# Patient Record
Sex: Male | Born: 1990 | Race: White | Hispanic: Yes | Marital: Married | Smoking: Never smoker
Health system: Southern US, Community
[De-identification: ages and names within clinical notes are randomized; demographics above are authoritative.]

## PROBLEM LIST (undated history)

## (undated) DIAGNOSIS — F4310 Post-traumatic stress disorder, unspecified: Secondary | ICD-10-CM

## (undated) DIAGNOSIS — R011 Cardiac murmur, unspecified: Secondary | ICD-10-CM

## (undated) DIAGNOSIS — M1990 Unspecified osteoarthritis, unspecified site: Secondary | ICD-10-CM

## (undated) HISTORY — PX: WISDOM TOOTH EXTRACTION: SHX21

## (undated) HISTORY — PX: TONSILLECTOMY: SUR1361

---

## 2015-02-15 DIAGNOSIS — F4310 Post-traumatic stress disorder, unspecified: Secondary | ICD-10-CM

## 2015-02-15 HISTORY — DX: Post-traumatic stress disorder, unspecified: F43.10

## 2017-05-05 ENCOUNTER — Ambulatory Visit: Payer: Self-pay | Admitting: Orthopedic Surgery

## 2017-05-08 NOTE — Patient Instructions (Signed)
Dwayne Kramer  05/08/2017   Your procedure is scheduled on: Thursday 05/11/2017  Report to [facility] Main  Entrance   Report to admitting at  100 PM     Call this number if you have problems the morning of surgery [phone]    Remember: Do not eat food  :After Midnight.May have clear liquids from midnight up until 0900 am then nothing until after surgery!     CLEAR LIQUID DIET   Foods Allowed                                                                     Foods Excluded  Coffee and tea, regular and decaf                             liquids that you cannot  Plain Jell-O in any flavor                                             see through such as: Fruit ices (not with fruit pulp)                                     milk, soups, orange juice  Iced Popsicles                                    All solid food Carbonated beverages, regular and diet                                    Cranberry, grape and apple juices Sports drinks like Gatorade Lightly seasoned clear broth or consume(fat free) Sugar, honey syrup  Sample Menu Breakfast                                Lunch                                     Supper Cranberry juice                    Beef broth                            Chicken broth Jell-O                                     Grape juice                           Apple juice Coffee or  tea                        Jell-O                                      Popsicle                                                Coffee or tea                        Coffee or tea  _____________________________________________________________________     Take these medicines the morning of surgery with A SIP OF WATER: none                                You may not have any metal on your body including hair pins and              piercings  Do not wear jewelry, make-up, lotions, powders or perfumes, deodorant             Do not wear nail polish.  Do  not shave  48 hours prior to surgery.              Men may shave face and neck.   Do not bring valuables to the hospital. [facility]wayne Kramer IS NOT             RESPONSIBLE   FOR VALUABLES.  Contacts, dentures or bridgework may not be worn into surgery.  Leave suitcase in the car. After surgery it may be brought to your room.                  Please read over the following fact sheets you were given: _____________________________________________________________________             [facility] - Preparing for Surgery Before surgery, you can play an important role.  Because skin is not sterile, your skin needs to be as free of germs as possible.  You can reduce the number of germs on your skin by washing with CHG (chlorahexidine gluconate) soap before surgery.  CHG is an antiseptic cleaner which kills germs and bonds with the skin to continue killing germs even after washing. Please DO NOT use if you have an allergy to CHG or antibacterial soaps.  If your skin becomes reddened/irritated stop using the CHG and inform your nurse when you arrive at Short Stay. Do not shave (including legs and underarms) for at least 48 hours prior to the first CHG shower.  You may shave your face/neck. Please follow these instructions carefully:  1.  Shower with CHG Soap the night before surgery and the  morning of Surgery.  2.  If you choose to wash your hair, wash your hair first as usual with your  normal  shampoo.  3.  After you shampoo, rinse your hair and body thoroughly to remove the  shampoo.                           4.  Use CHG as you would any other liquid soap.  You  can apply chg directly  to the skin and wash                       Gently with a scrungie or clean washcloth.  5.  Apply the CHG Soap to your body ONLY FROM THE NECK DOWN.   Do not use on face/ open                           Wound or open sores. Avoid contact with eyes, ears mouth and genitals (private parts).                       Wash face,   Genitals (private parts) with your normal soap.             6.  Wash thoroughly, paying special attention to the area where your surgery  will be performed.  7.  Thoroughly rinse your body with warm water from the neck down.  8.  DO NOT shower/wash with your normal soap after using and rinsing off  the CHG Soap.                9.  Pat yourself dry with a clean towel.            10.  Wear clean pajamas.            11.  Place clean sheets on your bed the night of your first shower and do not  sleep with pets. Day of Surgery : Do not apply any lotions/deodorants the morning of surgery.  Please wear clean clothes to the hospital/surgery center.  FAILURE TO FOLLOW THESE INSTRUCTIONS MAY RESULT IN THE CANCELLATION OF YOUR SURGERY PATIENT SIGNATURE_________________________________  NURSE SIGNATURE__________________________________  ________________________________________________________________________   Dwayne MireIncentive Spirometer  An incentive spirometer is a tool that can help keep your lungs clear and active. This tool measures how well you are filling your lungs with each breath. Taking long deep breaths may help reverse or decrease the chance of developing breathing (pulmonary) problems (especially infection) following:  A long period of time when you are unable to move or be active. BEFORE THE PROCEDURE   If the spirometer includes an indicator to show your best effort, your nurse or respiratory therapist will set it to a desired goal.  If possible, sit up straight or lean slightly forward. Try not to slouch.  Hold the incentive spirometer in an upright position. INSTRUCTIONS FOR USE  1. Sit on the edge of your bed if possible, or sit up as far as you can in bed or on a chair. 2. Hold the incentive spirometer in an upright position. 3. Breathe out normally. 4. Place the mouthpiece in your mouth and seal your lips tightly around it. 5. Breathe in slowly and as deeply as possible, raising  the piston or the ball toward the top of the column. 6. Hold your breath for 3-5 seconds or for as long as possible. Allow the piston or ball to fall to the bottom of the column. 7. Remove the mouthpiece from your mouth and breathe out normally. 8. Rest for a few seconds and repeat Steps 1 through 7 at least 10 times every 1-2 hours when you are awake. Take your time and take a few normal breaths between deep breaths. 9. The spirometer may include an indicator to show your best effort. Use the indicator as a goal to work toward  during each repetition. 10. After each set of 10 deep breaths, practice coughing to be sure your lungs are clear. If you have an incision (the cut made at the time of surgery), support your incision when coughing by placing a pillow or rolled up towels firmly against it. Once you are able to get out of bed, walk around indoors and cough well. You may stop using the incentive spirometer when instructed by your caregiver.  RISKS AND COMPLICATIONS  Take your time so you do not get dizzy or light-headed.  If you are in pain, you may need to take or ask for pain medication before doing incentive spirometry. It is harder to take a deep breath if you are having pain. AFTER USE  Rest and breathe slowly and easily.  It can be helpful to keep track of a log of your progress. Your caregiver can provide you with a simple table to help with this. If you are using the spirometer at home, follow these instructions: [place] IF:   You are having difficultly using the spirometer.  You have trouble using the spirometer as often as instructed.  Your pain medication is not giving enough relief while using the spirometer.  You develop fever of 100.5 F (38.1 C) or higher. SEEK IMMEDIATE MEDICAL CARE IF:   You cough up bloody sputum that had not been present before.  You develop fever of 102 F (38.9 C) or greater.  You develop worsening pain at or near the incision  site. MAKE SURE YOU:   Understand these instructions.  Will watch your condition.  Will get help right away if you are not doing well or get worse. Document Released: 06/13/2006 Document Revised: 04/25/2011 Document Reviewed: 08/14/2006 [employer] Patient Information 2014 [employer], [place].   ________________________________________________________________________  WHAT IS A BLOOD TRANSFUSION? Blood Transfusion Information  A transfusion is the replacement of blood or some of its parts. Blood is made up of multiple cells which provide different functions.  Red blood cells carry oxygen and are used for blood loss replacement.  White blood cells fight against infection.  Platelets control bleeding.  Plasma helps clot blood.  Other blood products are available for specialized needs, such as hemophilia or other clotting disorders. BEFORE THE TRANSFUSION  Who gives blood for transfusions?   Healthy volunteers who are fully evaluated to make sure their blood is safe. This is blood bank blood. Transfusion therapy is the safest it has ever been in the practice of medicine. Before blood is taken from a donor, a complete history is taken to make sure that person has no history of diseases nor engages in risky social behavior (examples are intravenous drug use or sexual activity with multiple partners). The donor's travel history is screened to minimize risk of transmitting infections, such as malaria. The donated blood is tested for signs of infectious diseases, such as HIV and hepatitis. The blood is then tested to be sure it is compatible with you in order to minimize the chance of a transfusion reaction. If you or a relative donates blood, this is often done in anticipation of surgery and is not appropriate for emergency situations. It takes many days to process the donated blood. RISKS AND COMPLICATIONS Although transfusion therapy is very safe and saves many lives, the main dangers of  transfusion include:   Getting an infectious disease.  Developing a transfusion reaction. This is an allergic reaction to something in the blood you were given. Every precaution is taken to  prevent this. The decision to have a blood transfusion has been considered carefully by your caregiver before blood is given. Blood is not given unless the benefits outweigh the risks. AFTER THE TRANSFUSION  Right after receiving a blood transfusion, you will usually feel much better and more energetic. This is especially true if your red blood cells have gotten low (anemic). The transfusion raises the level of the red blood cells which carry oxygen, and this usually causes an energy increase.  The nurse administering the transfusion will monitor you carefully for complications. HOME CARE INSTRUCTIONS  No special instructions are needed after a transfusion. You may find your energy is better. Speak with your caregiver about any limitations on activity for underlying diseases you may have. SEEK MEDICAL CARE IF:   Your condition is not improving after your transfusion.  You develop redness or irritation at the intravenous (IV) site. SEEK IMMEDIATE MEDICAL CARE IF:  Any of the following symptoms occur over the next 12 hours:  Shaking chills.  You have a temperature by mouth above 102 F (38.9 C), not controlled by medicine.  Chest, back, or muscle pain.  People around you feel you are not acting correctly or are confused.  Shortness of breath or difficulty breathing.  Dizziness and fainting.  You get a rash or develop hives.  You have a decrease in urine output.  Your urine turns a dark color or changes to pink, red, or brown. Any of the following symptoms occur over the next 10 days:  You have a temperature by mouth above 102 F (38.9 C), not controlled by medicine.  Shortness of breath.  Weakness after normal activity.  The white part of the eye turns yellow (jaundice).  You have a  decrease in the amount of urine or are urinating less often.  Your urine turns a dark color or changes to pink, red, or brown. Document Released: 01/29/2000 Document Revised: 04/25/2011 Document Reviewed: 09/17/2007 [facility] Patient Information 2014 [place], [place].  _______________________________________________________________________

## 2017-05-09 ENCOUNTER — Ambulatory Visit: Payer: Self-pay | Admitting: Orthopedic Surgery

## 2017-05-09 ENCOUNTER — Encounter (HOSPITAL_COMMUNITY)
Admission: RE | Admit: 2017-05-09 | Discharge: 2017-05-09 | Disposition: A | Discharge status: Home / Self Care | Payer: No Typology Code available for payment source | Source: Ambulatory Visit | Attending: Orthopedic Surgery | Admitting: Orthopedic Surgery

## 2017-05-09 ENCOUNTER — Other Ambulatory Visit: Payer: Self-pay

## 2017-05-09 ENCOUNTER — Encounter (HOSPITAL_COMMUNITY): Payer: Self-pay

## 2017-05-09 DIAGNOSIS — Z01812 Encounter for preprocedural laboratory examination: Secondary | ICD-10-CM

## 2017-05-09 DIAGNOSIS — M8788 Other osteonecrosis, other site: Secondary | ICD-10-CM | POA: Insufficient documentation

## 2017-05-09 HISTORY — DX: Unspecified osteoarthritis, unspecified site: M19.90

## 2017-05-09 LAB — CBC
HEMATOCRIT: 43.5 % (ref 39.0–52.0)
Hemoglobin: 15.3 g/dL (ref 13.0–17.0)
MCH: 33.5 pg (ref 26.0–34.0)
MCHC: 35.2 g/dL (ref 30.0–36.0)
MCV: 95.2 fL (ref 78.0–100.0)
Platelets: 268 10*3/uL (ref 150–400)
RBC: 4.57 MIL/uL (ref 4.22–5.81)
RDW: 13 % (ref 11.5–15.5)
WBC: 5.7 10*3/uL (ref 4.0–10.5)

## 2017-05-09 LAB — BASIC METABOLIC PANEL
Anion gap: 9 (ref 5–15)
BUN: 10 mg/dL (ref 6–20)
CALCIUM: 9.8 mg/dL (ref 8.9–10.3)
CHLORIDE: 102 mmol/L (ref 101–111)
CO2: 26 mmol/L (ref 22–32)
Creatinine, Ser: 0.94 mg/dL (ref 0.61–1.24)
GFR calc Af Amer: >60 mL/min (ref >60)
GFR calc non Af Amer: >60 mL/min (ref >60)
GLUCOSE: 102 mg/dL — AB (ref 65–99)
Potassium: 4.4 mmol/L (ref 3.5–5.1)
Sodium: 137 mmol/L (ref 135–145)

## 2017-05-09 LAB — SURGICAL PCR SCREEN
MRSA, PCR: NEGATIVE
Staphylococcus aureus: POSITIVE — AB

## 2017-05-09 LAB — ABO/RH: ABO/RH(D): A POS

## 2017-05-09 NOTE — H&P (View-Only) (Signed)
TOTAL HIP ADMISSION H&P  Patient is admitted for right total hip arthroplasty.  Subjective:  Chief Complaint: right hip pain  HPI: Dwayne Kramer, 27 y.o. 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  Onset of symptoms was gradual starting 2 years ago with rapidlly worsening course since that time.The patient noted no past surgery on the right hip(s).  Patient currently rates pain in the right hip at 10 out of 10 with activity. Patient has night pain, worsening of pain with activity and weight bearing, pain that interfers with activities of daily living, pain with passive range of motion and joint swelling. Patient has evidence of AVN by imaging studies. This condition presents safety issues increasing the risk of falls.There is no current active infection.  There are no active problems to display for this patient.  Past Medical History:  Diagnosis Date  . Arthritis     Past Surgical History:  Procedure Laterality Date  . TONSILLECTOMY    . WISDOM TOOTH EXTRACTION      Current Outpatient Medications  Medication Sig Dispense Refill Last Dose  . cyclobenzaprine (FLEXERIL) 10 MG tablet Take 10 mg by mouth 2 (two) times daily.     . diclofenac sodium (VOLTAREN) 1 % GEL Apply 1 application topically 3 (three) times daily.     Marland Kitchen gabapentin (NEURONTIN) 300 MG capsule Take 300 mg by mouth at bedtime.     Marland Kitchen ibuprofen (ADVIL,MOTRIN) 800 MG tablet Take 800 mg by mouth every 8 (eight) hours as needed for headache or moderate pain.      No current facility-administered medications for this visit.    No Known Allergies  Social History   Tobacco Use  . Smoking status: Never Smoker  . Smokeless tobacco: Never  Used  Substance Use Topics  . Alcohol use: Yes    Comment: occassionally    No family history on file.   Review of Systems  Constitutional: Negative.   HENT: Negative.   Eyes: Negative.   Respiratory: Negative.   Cardiovascular: Negative.   Gastrointestinal: Negative.   Genitourinary: Negative.   Musculoskeletal: Positive for joint pain.  Skin: Negative.   Neurological: Negative.   Endo/Heme/Allergies: Negative.   Psychiatric/Behavioral: Negative.     Objective:  Physical Exam  Vitals reviewed. Constitutional: He is oriented to person, place, and time. He appears well-developed and well-nourished.  HENT:  Head: Normocephalic and atraumatic.  Eyes: Pupils are equal, round, and reactive to light. Conjunctivae and EOM are normal.  Neck: Normal range of motion. Neck supple. No thyromegaly present.  Cardiovascular: Normal rate, regular rhythm and intact distal pulses.  Respiratory: Effort normal. No respiratory distress.  GI: Soft. He exhibits no distension.  Genitourinary:  Genitourinary Comments: deferred  Musculoskeletal:       Right hip: He exhibits decreased range of motion and bony tenderness.  Neurological: He is alert and oriented to person, place, and time. He has normal reflexes.  Skin: Skin is warm and dry.  Psychiatric: He has a normal mood and affect. His behavior is normal. Judgment and thought content normal.    Vital signs in last 24 hours: @VSRANGES @  Labs:   Estimated body mass index is 31.17 kg/m as calculated from the following:   Height as of an earlier encounter  on 05/09/17: 5\' 7"  (1.702 m).   Weight as of an earlier encounter on 05/09/17: 90.3 kg (199 lb).   Imaging Review Plain radiographs demonstrate AVN with collapse of the right hip(s). The bone quality appears to be adequate for age and reported activity level.  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 The treatment options including medical management, injection therapy, arthroscopy and arthroplasty were discussed at length. The risks and benefits of total hip arthroplasty were presented and reviewed. The risks due to aseptic loosening, infection, stiffness, dislocation/subluxation,  thromboembolic complications and other imponderables were discussed.  The patient acknowledged the explanation, agreed to proceed with the plan and consent was signed. Patient is being admitted for inpatient treatment for surgery, pain control, PT, OT, prophylactic antibiotics, VTE prophylaxis, progressive ambulation and ADL's and discharge planning.The patient is planning to be discharged home with HEP

## 2017-05-09 NOTE — H&P (Signed)
TOTAL HIP ADMISSION H&P  Patient is admitted for right total hip arthroplasty.  Subjective:  Chief Complaint: right hip pain  HPI: Dwayne Kramer, 27 y.o. 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  Onset of symptoms was gradual starting 2 years ago with rapidlly worsening course since that time.The patient noted no past surgery on the right hip(s).  Patient currently rates pain in the right hip at 10 out of 10 with activity. Patient has night pain, worsening of pain with activity and weight bearing, pain that interfers with activities of daily living, pain with passive range of motion and joint swelling. Patient has evidence of AVN by imaging studies. This condition presents safety issues increasing the risk of falls.There is no current active infection.  There are no active problems to display for this patient.  Past Medical History:  Diagnosis Date  . Arthritis     Past Surgical History:  Procedure Laterality Date  . TONSILLECTOMY    . WISDOM TOOTH EXTRACTION      Current Outpatient Medications  Medication Sig Dispense Refill Last Dose  . cyclobenzaprine (FLEXERIL) 10 MG tablet Take 10 mg by mouth 2 (two) times daily.     . diclofenac sodium (VOLTAREN) 1 % GEL Apply 1 application topically 3 (three) times daily.     Marland Kitchen gabapentin (NEURONTIN) 300 MG capsule Take 300 mg by mouth at bedtime.     Marland Kitchen ibuprofen (ADVIL,MOTRIN) 800 MG tablet Take 800 mg by mouth every 8 (eight) hours as needed for headache or moderate pain.      No current facility-administered medications for this visit.    No Known Allergies  Social History   Tobacco Use  . Smoking status: Never Smoker  . Smokeless tobacco: Never  Used  Substance Use Topics  . Alcohol use: Yes    Comment: occassionally    No family history on file.   Review of Systems  Constitutional: Negative.   HENT: Negative.   Eyes: Negative.   Respiratory: Negative.   Cardiovascular: Negative.   Gastrointestinal: Negative.   Genitourinary: Negative.   Musculoskeletal: Positive for joint pain.  Skin: Negative.   Neurological: Negative.   Endo/Heme/Allergies: Negative.   Psychiatric/Behavioral: Negative.     Objective:  Physical Exam  Vitals reviewed. Constitutional: He is oriented to person, place, and time. He appears well-developed and well-nourished.  HENT:  Head: Normocephalic and atraumatic.  Eyes: Pupils are equal, round, and reactive to light. Conjunctivae and EOM are normal.  Neck: Normal range of motion. Neck supple. No thyromegaly present.  Cardiovascular: Normal rate, regular rhythm and intact distal pulses.  Respiratory: Effort normal. No respiratory distress.  GI: Soft. He exhibits no distension.  Genitourinary:  Genitourinary Comments: deferred  Musculoskeletal:       Right hip: He exhibits decreased range of motion and bony tenderness.  Neurological: He is alert and oriented to person, place, and time. He has normal reflexes.  Skin: Skin is warm and dry.  Psychiatric: He has a normal mood and affect. His behavior is normal. Judgment and thought content normal.    Vital signs in last 24 hours: @VSRANGES @  Labs:   Estimated body mass index is 31.17 kg/m as calculated from the following:   Height as of an earlier encounter  on 05/09/17: 5\' 7"  (1.702 m).   Weight as of an earlier encounter on 05/09/17: 90.3 kg (199 lb).   Imaging Review Plain radiographs demonstrate AVN with collapse of the right hip(s). The bone quality appears to be adequate for age and reported activity level.  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 The treatment options including medical management, injection therapy, arthroscopy and arthroplasty were discussed at length. The risks and benefits of total hip arthroplasty were presented and reviewed. The risks due to aseptic loosening, infection, stiffness, dislocation/subluxation,  thromboembolic complications and other imponderables were discussed.  The patient acknowledged the explanation, agreed to proceed with the plan and consent was signed. Patient is being admitted for inpatient treatment for surgery, pain control, PT, OT, prophylactic antibiotics, VTE prophylaxis, progressive ambulation and ADL's and discharge planning.The patient is planning to be discharged home with HEP

## 2017-05-10 MED ORDER — TRANEXAMIC ACID 1000 MG/10ML IV SOLN
1000.0000 mg | INTRAVENOUS | Status: AC
  Administered 2017-05-11: 1000 mg via INTRAVENOUS
  Filled 2017-05-10: qty 1100

## 2017-05-11 ENCOUNTER — Inpatient Hospital Stay (HOSPITAL_COMMUNITY): Payer: No Typology Code available for payment source | Admitting: Registered Nurse

## 2017-05-11 ENCOUNTER — Telehealth (HOSPITAL_COMMUNITY): Payer: Self-pay | Admitting: *Deleted

## 2017-05-11 ENCOUNTER — Inpatient Hospital Stay (HOSPITAL_COMMUNITY): Payer: No Typology Code available for payment source

## 2017-05-11 ENCOUNTER — Encounter (HOSPITAL_COMMUNITY): Payer: Self-pay | Admitting: Registered Nurse

## 2017-05-11 ENCOUNTER — Encounter (HOSPITAL_COMMUNITY)
Admission: RE | Disposition: A | Discharge status: Home / Self Care | Payer: Self-pay | Source: Ambulatory Visit | Attending: Orthopedic Surgery

## 2017-05-11 ENCOUNTER — Inpatient Hospital Stay (HOSPITAL_COMMUNITY)
Admission: RE | Admit: 2017-05-11 | Discharge: 2017-05-12 | DRG: 470 | Disposition: A | Discharge status: Home / Self Care | Payer: No Typology Code available for payment source | Source: Ambulatory Visit | Attending: Orthopedic Surgery | Admitting: Orthopedic Surgery

## 2017-05-11 DIAGNOSIS — M87051 Idiopathic aseptic necrosis of right femur: Secondary | ICD-10-CM | POA: Diagnosis present

## 2017-05-11 DIAGNOSIS — M1611 Unilateral primary osteoarthritis, right hip: Secondary | ICD-10-CM | POA: Diagnosis present

## 2017-05-11 DIAGNOSIS — Z09 Encounter for follow-up examination after completed treatment for conditions other than malignant neoplasm: Secondary | ICD-10-CM

## 2017-05-11 DIAGNOSIS — M879 Osteonecrosis, unspecified: Secondary | ICD-10-CM | POA: Diagnosis present

## 2017-05-11 DIAGNOSIS — Z9181 History of falling: Secondary | ICD-10-CM

## 2017-05-11 DIAGNOSIS — Z419 Encounter for procedure for purposes other than remedying health state, unspecified: Secondary | ICD-10-CM

## 2017-05-11 HISTORY — PX: TOTAL HIP ARTHROPLASTY: SHX124

## 2017-05-11 LAB — TYPE AND SCREEN
ABO/RH(D): A POS
Antibody Screen: NEGATIVE

## 2017-05-11 SURGERY — ARTHROPLASTY, HIP, TOTAL, ANTERIOR APPROACH
Anesthesia: Spinal | Site: Hip | Laterality: Right

## 2017-05-11 MED ORDER — DOCUSATE SODIUM 100 MG PO CAPS
100.0000 mg | ORAL_CAPSULE | Freq: Two times a day (BID) | ORAL | Status: DC
  Administered 2017-05-11 – 2017-05-12 (×2): 100 mg via ORAL
  Filled 2017-05-11 (×2): qty 1

## 2017-05-11 MED ORDER — OXYCODONE HCL 5 MG/5ML PO SOLN
5.0000 mg | Freq: Once | ORAL | Status: DC | PRN
  Filled 2017-05-11: qty 5

## 2017-05-11 MED ORDER — MORPHINE SULFATE (PF) 2 MG/ML IV SOLN
0.5000 mg | INTRAVENOUS | Status: DC | PRN
  Administered 2017-05-12: 1 mg via INTRAVENOUS
  Filled 2017-05-11: qty 1

## 2017-05-11 MED ORDER — OXYCODONE HCL 5 MG PO TABS: 5.0000 mg | ORAL_TABLET | Freq: Once | ORAL | Status: DC | PRN

## 2017-05-11 MED ORDER — KETOROLAC TROMETHAMINE 30 MG/ML IJ SOLN
INTRAMUSCULAR | Status: DC | PRN
  Administered 2017-05-11: 30 mg

## 2017-05-11 MED ORDER — METHOCARBAMOL 500 MG PO TABS
500.0000 mg | ORAL_TABLET | Freq: Four times a day (QID) | ORAL | Status: DC | PRN
  Administered 2017-05-12: 500 mg via ORAL
  Filled 2017-05-11: qty 1

## 2017-05-11 MED ORDER — DIPHENHYDRAMINE HCL 12.5 MG/5ML PO ELIX: 12.5000 mg | ORAL_SOLUTION | ORAL | Status: DC | PRN

## 2017-05-11 MED ORDER — POVIDONE-IODINE 10 % EX SWAB: 2.0000 "application " | Freq: Once | CUTANEOUS | Status: DC

## 2017-05-11 MED ORDER — PROPOFOL 10 MG/ML IV BOLUS
INTRAVENOUS | Status: AC
  Filled 2017-05-11: qty 20

## 2017-05-11 MED ORDER — POLYETHYLENE GLYCOL 3350 17 G PO PACK: 17.0000 g | PACK | Freq: Every day | ORAL | Status: DC | PRN

## 2017-05-11 MED ORDER — HYDROCODONE-ACETAMINOPHEN 5-325 MG PO TABS
1.0000 | ORAL_TABLET | ORAL | Status: DC | PRN
  Administered 2017-05-11 – 2017-05-12 (×3): 2 via ORAL
  Filled 2017-05-11 (×3): qty 2

## 2017-05-11 MED ORDER — WATER FOR IRRIGATION, STERILE IR SOLN
Status: DC | PRN
  Administered 2017-05-11: 2000 mL

## 2017-05-11 MED ORDER — SODIUM CHLORIDE 0.9 % IR SOLN
Status: DC | PRN
  Administered 2017-05-11: 4000 mL

## 2017-05-11 MED ORDER — METOCLOPRAMIDE HCL 5 MG PO TABS: 5.0000 mg | ORAL_TABLET | Freq: Three times a day (TID) | ORAL | Status: DC | PRN

## 2017-05-11 MED ORDER — FENTANYL CITRATE (PF) 100 MCG/2ML IJ SOLN
INTRAMUSCULAR | Status: DC | PRN
  Administered 2017-05-11: 100 ug via INTRAVENOUS
  Administered 2017-05-11 (×2): 50 ug via INTRAVENOUS

## 2017-05-11 MED ORDER — ONDANSETRON HCL 4 MG/2ML IJ SOLN
4.0000 mg | Freq: Four times a day (QID) | INTRAMUSCULAR | Status: DC | PRN
  Filled 2017-05-11: qty 2

## 2017-05-11 MED ORDER — FENTANYL CITRATE (PF) 100 MCG/2ML IJ SOLN
INTRAMUSCULAR | Status: AC
  Filled 2017-05-11: qty 2

## 2017-05-11 MED ORDER — SODIUM CHLORIDE 0.9 % IJ SOLN
INTRAMUSCULAR | Status: DC | PRN
  Administered 2017-05-11: 29 mL

## 2017-05-11 MED ORDER — HYDROMORPHONE HCL 1 MG/ML IJ SOLN
0.2500 mg | INTRAMUSCULAR | Status: DC | PRN
  Administered 2017-05-11 (×4): 0.5 mg via INTRAVENOUS

## 2017-05-11 MED ORDER — ONDANSETRON HCL 4 MG/2ML IJ SOLN
INTRAMUSCULAR | Status: DC | PRN
  Administered 2017-05-11: 4 mg via INTRAVENOUS

## 2017-05-11 MED ORDER — HYDROMORPHONE HCL 1 MG/ML IJ SOLN
INTRAMUSCULAR | Status: AC
  Filled 2017-05-11: qty 1

## 2017-05-11 MED ORDER — PHENOL 1.4 % MT LIQD
1.0000 | OROMUCOSAL | Status: DC | PRN
  Filled 2017-05-11: qty 177

## 2017-05-11 MED ORDER — MENTHOL 3 MG MT LOZG: 1.0000 | LOZENGE | OROMUCOSAL | Status: DC | PRN

## 2017-05-11 MED ORDER — ASPIRIN 81 MG PO CHEW
81.0000 mg | CHEWABLE_TABLET | Freq: Two times a day (BID) | ORAL | Status: DC
  Administered 2017-05-11 – 2017-05-12 (×2): 81 mg via ORAL
  Filled 2017-05-11 (×2): qty 1

## 2017-05-11 MED ORDER — ACETAMINOPHEN 10 MG/ML IV SOLN
INTRAVENOUS | Status: AC
  Filled 2017-05-11: qty 100

## 2017-05-11 MED ORDER — SODIUM CHLORIDE 0.9 % IV SOLN
INTRAVENOUS | Status: DC
  Administered 2017-05-11 – 2017-05-12 (×3): via INTRAVENOUS

## 2017-05-11 MED ORDER — HYDROCODONE-ACETAMINOPHEN 7.5-325 MG PO TABS: 1.0000 | ORAL_TABLET | ORAL | Status: DC | PRN

## 2017-05-11 MED ORDER — ONDANSETRON HCL 4 MG/2ML IJ SOLN
INTRAMUSCULAR | Status: AC
  Filled 2017-05-11: qty 2

## 2017-05-11 MED ORDER — METOCLOPRAMIDE HCL 5 MG/ML IJ SOLN: 5.0000 mg | Freq: Three times a day (TID) | INTRAMUSCULAR | Status: DC | PRN

## 2017-05-11 MED ORDER — ACETAMINOPHEN 10 MG/ML IV SOLN
1000.0000 mg | INTRAVENOUS | Status: AC
  Administered 2017-05-11: 1000 mg via INTRAVENOUS
  Filled 2017-05-11: qty 100

## 2017-05-11 MED ORDER — CEFAZOLIN SODIUM-DEXTROSE 2-4 GM/100ML-% IV SOLN
2.0000 g | Freq: Four times a day (QID) | INTRAVENOUS | Status: AC
  Administered 2017-05-11 – 2017-05-12 (×2): 2 g via INTRAVENOUS
  Filled 2017-05-11 (×2): qty 100

## 2017-05-11 MED ORDER — CHLORHEXIDINE GLUCONATE 4 % EX LIQD: 60.0000 mL | Freq: Once | CUTANEOUS | Status: DC

## 2017-05-11 MED ORDER — ISOPROPYL ALCOHOL 70 % SOLN
Status: DC | PRN
  Administered 2017-05-11: 1 via TOPICAL

## 2017-05-11 MED ORDER — MIDAZOLAM HCL 2 MG/2ML IJ SOLN
INTRAMUSCULAR | Status: AC
  Filled 2017-05-11: qty 2

## 2017-05-11 MED ORDER — CEFAZOLIN SODIUM-DEXTROSE 2-4 GM/100ML-% IV SOLN
2.0000 g | INTRAVENOUS | Status: AC
  Administered 2017-05-11: 2 g via INTRAVENOUS
  Filled 2017-05-11: qty 100

## 2017-05-11 MED ORDER — ALUM & MAG HYDROXIDE-SIMETH 200-200-20 MG/5ML PO SUSP: 30.0000 mL | ORAL | Status: DC | PRN

## 2017-05-11 MED ORDER — PROPOFOL 10 MG/ML IV BOLUS
INTRAVENOUS | Status: AC
  Filled 2017-05-11: qty 40

## 2017-05-11 MED ORDER — LACTATED RINGERS IV SOLN
INTRAVENOUS | Status: DC
  Administered 2017-05-11 (×2): via INTRAVENOUS
  Administered 2017-05-11: 1000 mL via INTRAVENOUS

## 2017-05-11 MED ORDER — MIDAZOLAM HCL 5 MG/5ML IJ SOLN
INTRAMUSCULAR | Status: DC | PRN
  Administered 2017-05-11 (×2): 2 mg via INTRAVENOUS

## 2017-05-11 MED ORDER — DEXAMETHASONE SODIUM PHOSPHATE 10 MG/ML IJ SOLN
INTRAMUSCULAR | Status: AC
  Filled 2017-05-11: qty 1

## 2017-05-11 MED ORDER — ONDANSETRON HCL 4 MG PO TABS
4.0000 mg | ORAL_TABLET | Freq: Four times a day (QID) | ORAL | Status: DC | PRN
  Administered 2017-05-11: 4 mg via ORAL

## 2017-05-11 MED ORDER — DEXAMETHASONE SODIUM PHOSPHATE 10 MG/ML IJ SOLN
10.0000 mg | Freq: Once | INTRAMUSCULAR | Status: AC
  Administered 2017-05-12: 10 mg via INTRAVENOUS
  Filled 2017-05-11: qty 1

## 2017-05-11 MED ORDER — ACETAMINOPHEN 10 MG/ML IV SOLN: 1000.0000 mg | Freq: Once | INTRAVENOUS | Status: DC | PRN

## 2017-05-11 MED ORDER — FENTANYL CITRATE (PF) 100 MCG/2ML IJ SOLN
INTRAMUSCULAR | Status: AC
Start: 2017-05-11 — End: 2017-05-11
  Filled 2017-05-11: qty 2

## 2017-05-11 MED ORDER — SODIUM CHLORIDE 0.9 % IV SOLN: INTRAVENOUS | Status: DC

## 2017-05-11 MED ORDER — PROMETHAZINE HCL 25 MG/ML IJ SOLN
6.2500 mg | INTRAMUSCULAR | Status: DC | PRN
Start: 2017-05-11 — End: 2017-05-11

## 2017-05-11 MED ORDER — SODIUM CHLORIDE 0.9 % IR SOLN
Status: DC | PRN
  Administered 2017-05-11: 1000 mL

## 2017-05-11 MED ORDER — DEXAMETHASONE SODIUM PHOSPHATE 10 MG/ML IJ SOLN
INTRAMUSCULAR | Status: DC | PRN
  Administered 2017-05-11: 10 mg via INTRAVENOUS

## 2017-05-11 MED ORDER — BUPIVACAINE HCL (PF) 0.25 % IJ SOLN
INTRAMUSCULAR | Status: DC | PRN
  Administered 2017-05-11: 30 mL

## 2017-05-11 MED ORDER — PROPOFOL 10 MG/ML IV BOLUS
INTRAVENOUS | Status: DC | PRN
  Administered 2017-05-11: 20 mg via INTRAVENOUS

## 2017-05-11 MED ORDER — GABAPENTIN 300 MG PO CAPS
300.0000 mg | ORAL_CAPSULE | Freq: Every day | ORAL | Status: DC
  Administered 2017-05-11: 300 mg via ORAL
  Filled 2017-05-11: qty 1

## 2017-05-11 MED ORDER — KETOROLAC TROMETHAMINE 15 MG/ML IJ SOLN
15.0000 mg | Freq: Four times a day (QID) | INTRAMUSCULAR | Status: DC
  Administered 2017-05-11 – 2017-05-12 (×2): 15 mg via INTRAVENOUS
  Filled 2017-05-11 (×2): qty 1

## 2017-05-11 MED ORDER — PROPOFOL 500 MG/50ML IV EMUL
INTRAVENOUS | Status: DC | PRN
  Administered 2017-05-11: 125 ug/kg/min via INTRAVENOUS

## 2017-05-11 MED ORDER — BUPIVACAINE HCL (PF) 0.5 % IJ SOLN
INTRAMUSCULAR | Status: AC
  Filled 2017-05-11: qty 30

## 2017-05-11 MED ORDER — CEFAZOLIN SODIUM-DEXTROSE 2-4 GM/100ML-% IV SOLN
INTRAVENOUS | Status: AC
  Filled 2017-05-11: qty 100

## 2017-05-11 MED ORDER — BUPIVACAINE HCL (PF) 0.25 % IJ SOLN
INTRAMUSCULAR | Status: AC
  Filled 2017-05-11: qty 30

## 2017-05-11 MED ORDER — METHOCARBAMOL 1000 MG/10ML IJ SOLN
500.0000 mg | Freq: Four times a day (QID) | INTRAVENOUS | Status: DC | PRN
  Administered 2017-05-11: 500 mg via INTRAVENOUS
  Filled 2017-05-11: qty 550

## 2017-05-11 MED ORDER — ACETAMINOPHEN 325 MG PO TABS: 325.0000 mg | ORAL_TABLET | Freq: Four times a day (QID) | ORAL | Status: DC | PRN

## 2017-05-11 SURGICAL SUPPLY — 53 items
BAG ZIPLOCK 12X15 (MISCELLANEOUS) ×3 IMPLANT
CAPT HIP TOTAL 2 ×3 IMPLANT
CHLORAPREP W/TINT 26ML (MISCELLANEOUS) ×6 IMPLANT
CLOTH BEACON ORANGE TIMEOUT ST (SAFETY) ×3 IMPLANT
COVER PERINEAL POST (MISCELLANEOUS) ×3 IMPLANT
COVER SURGICAL LIGHT HANDLE (MISCELLANEOUS) ×3 IMPLANT
DECANTER SPIKE VIAL GLASS SM (MISCELLANEOUS) ×3 IMPLANT
DERMABOND ADVANCED (GAUZE/BANDAGES/DRESSINGS) ×4
DERMABOND ADVANCED .7 DNX12 (GAUZE/BANDAGES/DRESSINGS) ×2 IMPLANT
DRAPE SHEET LG 3/4 BI-LAMINATE (DRAPES) ×9 IMPLANT
DRAPE STERI IOBAN 125X83 (DRAPES) ×3 IMPLANT
DRAPE U-SHAPE 47X51 STRL (DRAPES) ×6 IMPLANT
DRESSING AQUACEL AG SP 3.5X10 (GAUZE/BANDAGES/DRESSINGS) ×1 IMPLANT
DRSG AQUACEL AG ADV 3.5X10 (GAUZE/BANDAGES/DRESSINGS) ×3 IMPLANT
DRSG AQUACEL AG SP 3.5X10 (GAUZE/BANDAGES/DRESSINGS) ×3
ELECT PENCIL ROCKER SW 15FT (MISCELLANEOUS) ×6 IMPLANT
ELECT REM PT RETURN 15FT ADLT (MISCELLANEOUS) ×3 IMPLANT
GAUZE SPONGE 4X4 12PLY STRL (GAUZE/BANDAGES/DRESSINGS) ×3 IMPLANT
GLOVE BIO SURGEON STRL SZ8.5 (GLOVE) ×12 IMPLANT
GLOVE BIOGEL PI IND STRL 7.0 (GLOVE) ×2 IMPLANT
GLOVE BIOGEL PI IND STRL 7.5 (GLOVE) ×4 IMPLANT
GLOVE BIOGEL PI IND STRL 8 (GLOVE) ×1 IMPLANT
GLOVE BIOGEL PI IND STRL 8.5 (GLOVE) ×1 IMPLANT
GLOVE BIOGEL PI INDICATOR 7.0 (GLOVE) ×4
GLOVE BIOGEL PI INDICATOR 7.5 (GLOVE) ×8
GLOVE BIOGEL PI INDICATOR 8 (GLOVE) ×2
GLOVE BIOGEL PI INDICATOR 8.5 (GLOVE) ×2
GLOVE ECLIPSE 7.0 STRL STRAW (GLOVE) ×3 IMPLANT
GLOVE SURG SS PI 7.5 STRL IVOR (GLOVE) ×3 IMPLANT
GLOVE SURG SS PI 8.0 STRL IVOR (GLOVE) ×3 IMPLANT
GOWN SPEC L3 XXLG W/TWL (GOWN DISPOSABLE) ×6 IMPLANT
GOWN STRL REUS W/TWL 2XL LVL3 (GOWN DISPOSABLE) ×3 IMPLANT
GOWN STRL REUS W/TWL XL LVL3 (GOWN DISPOSABLE) ×3 IMPLANT
HANDPIECE INTERPULSE COAX TIP (DISPOSABLE) ×2
HOLDER FOLEY CATH W/STRAP (MISCELLANEOUS) ×3 IMPLANT
HOOD PEEL AWAY FLYTE STAYCOOL (MISCELLANEOUS) ×12 IMPLANT
MARKER SKIN DUAL TIP RULER LAB (MISCELLANEOUS) ×3 IMPLANT
NEEDLE SPNL 18GX3.5 QUINCKE PK (NEEDLE) ×3 IMPLANT
PACK ANTERIOR HIP CUSTOM (KITS) ×3 IMPLANT
SAW OSC TIP CART 19.5X105X1.3 (SAW) ×3 IMPLANT
SEALER BIPOLAR AQUA 6.0 (INSTRUMENTS) ×3 IMPLANT
SET HNDPC FAN SPRY TIP SCT (DISPOSABLE) ×1 IMPLANT
SUT ETHIBOND NAB CT1 #1 30IN (SUTURE) ×6 IMPLANT
SUT MNCRL AB 3-0 PS2 18 (SUTURE) ×3 IMPLANT
SUT MON AB 2-0 CT1 36 (SUTURE) ×6 IMPLANT
SUT STRATAFIX PDO 1 14 VIOLET (SUTURE) ×2
SUT STRATFX PDO 1 14 VIOLET (SUTURE) ×1
SUT VIC AB 2-0 CT1 27 (SUTURE) ×2
SUT VIC AB 2-0 CT1 TAPERPNT 27 (SUTURE) ×1 IMPLANT
SUTURE STRATFX PDO 1 14 VIOLET (SUTURE) ×1 IMPLANT
SYR 50ML LL SCALE MARK (SYRINGE) ×3 IMPLANT
TRAY FOLEY W/METER SILVER 16FR (SET/KITS/TRAYS/PACK) ×3 IMPLANT
YANKAUER SUCT BULB TIP 10FT TU (MISCELLANEOUS) ×3 IMPLANT

## 2017-05-11 NOTE — Anesthesia Preprocedure Evaluation (Addendum)
Anesthesia Evaluation  Patient identified by MRN, date of birth, ID band Patient awake    Reviewed: Allergy & Precautions, NPO status , Patient's Chart, lab work & pertinent test results  Airway Mallampati: II  TM Distance: >3 FB Neck ROM: Full    Dental no notable dental hx.    Pulmonary neg pulmonary ROS,    Pulmonary exam normal breath sounds clear to auscultation       Cardiovascular negative cardio ROS Normal cardiovascular exam Rhythm:Regular Rate:Normal     Neuro/Psych negative neurological ROS  negative psych ROS   GI/Hepatic negative GI ROS, Neg liver ROS,   Endo/Other  negative endocrine ROS  Renal/GU negative Renal ROS  negative genitourinary   Musculoskeletal  (+) Arthritis , Osteoarthritis,    Abdominal   Peds negative pediatric ROS (+)  Hematology negative hematology ROS (+)   Anesthesia Other Findings   Reproductive/Obstetrics negative OB ROS                             Anesthesia Physical Anesthesia Plan  ASA: II  Anesthesia Plan: Spinal   Post-op Pain Management:    Induction: Intravenous  PONV Risk Score and Plan: 2 and Ondansetron, Dexamethasone and Treatment may vary due to age or medical condition  Airway Management Planned: Simple Face Mask  Additional Equipment:   Intra-op Plan:   Post-operative Plan:   Informed Consent: I have reviewed the patients History and Physical, chart, labs and discussed the procedure including the risks, benefits and alternatives for the proposed anesthesia with the patient or authorized representative who has indicated his/her understanding and acceptance.     Dental advisory given  Plan Discussed with: CRNA and Surgeon  Anesthesia Plan Comments:         Anesthesia Quick Evaluation  

## 2017-05-11 NOTE — Interval H&P Note (Signed)
History and Physical Interval Note:  05/11/2017 1:14 PM  Dwayne Kramer  has presented today for surgery, with the diagnosis of Avascular necrosis right hip  The various methods of treatment have been discussed with the patient and family. After consideration of risks, benefits and other options for treatment, the patient has consented to  Procedure(s) with comments: RIGHT TOTAL HIP ARTHROPLASTY ANTERIOR APPROACH (Right) - Needs RNFA as a surgical intervention .  The patient's history has been reviewed, patient examined, no change in status, stable for surgery.  I have reviewed the patient's chart and labs.  Questions were answered to the patient's satisfaction.     Iline OvenBrian J Caytlyn Evers

## 2017-05-11 NOTE — Transfer of Care (Signed)
Immediate Anesthesia Transfer of Care Note  Patient: Dwayne Kramer  Procedure(s) Performed: RIGHT TOTAL HIP ARTHROPLASTY ANTERIOR APPROACH (Right Hip)  Patient Location: PACU  Anesthesia Type:Spinal  Level of Consciousness: awake, alert , oriented and patient cooperative  Airway & Oxygen Therapy: Patient Spontanous Breathing and Patient connected to face mask oxygen  Post-op Assessment: Report given to RN and Post -op Vital signs reviewed and stable  Post vital signs: stable  Last Vitals:  Vitals Value Taken Time  BP 133/83 05/11/2017  4:09 PM  Temp    Pulse 92 05/11/2017  4:13 PM  Resp 11 05/11/2017  4:13 PM  SpO2 100 % 05/11/2017  4:13 PM  Vitals shown include unvalidated device data.  Last Pain:  Vitals:   05/11/17 1208  TempSrc: Oral  PainSc:       Patients Stated Pain Goal: 4 (05/11/17 1207)  Complications: No apparent anesthesia complications

## 2017-05-11 NOTE — Op Note (Signed)
OPERATIVE REPORT  SURGEON: Samson Frederic, MD   ASSISTANT: Skip Mayer, PA-C.  PREOPERATIVE DIAGNOSIS: Right hip avascular necrosis.   POSTOPERATIVE DIAGNOSIS: Right hip  avascular necrosis.   PROCEDURE: Right total hip arthroplasty, anterior approach.   IMPLANTS: DePuy Tri Lock stem, size 4, hi offset. DePuy Pinnacle Cup, size 52 mm. DePuy Altrx liner, size 32 by 32 mm, neutral. DePuy Biolox ceramic head ball, size 32 + 5 mm.  ANESTHESIA:  Spinal  ESTIMATED BLOOD LOSS:-300 mL    ANTIBIOTICS: 2 g Ancef.  DRAINS: None.  COMPLICATIONS: None.   CONDITION: PACU - hemodynamically stable.   BRIEF CLINICAL NOTE: Dwayne Kramer is a 27 y.o. male with a long-standing history of Right hip avascular necrosis. After failing conservative management, the patient was indicated for total hip arthroplasty. The risks, benefits, and alternatives to the procedure were explained, and the patient elected to proceed.  PROCEDURE IN DETAIL: Surgical site was marked by myself in the pre-op holding area. Once inside the operating room, spinal anesthesia was obtained, and a foley catheter was inserted. The patient was then positioned on the Hana table. All bony prominences were well padded. The hip was prepped and draped in the normal sterile surgical fashion. A time-out was called verifying side and site of surgery. The patient received IV antibiotics within 60 minutes of beginning the procedure.  The direct anterior approach to the hip was performed through the Hueter interval. Lateral femoral circumflex vessels were treated with the Auqumantys. The anterior capsule was exposed and an inverted T capsulotomy was made.The femoral neck cut was made to the level of the templated cut. A corkscrew was placed into the head and the head was removed. The femoral head was found to be collapsed. The head was passed to the back table and was measured.  Acetabular exposure was achieved, and the  pulvinar and labrum were excised. Sequential reaming of the acetabulum was then performed up to a size 51 mm reamer. A 52 mm cup was then opened and impacted into place at approximately 40 degrees of abduction and 20 degrees of anteversion. The final polyethylene liner was impacted into place and acetabular osteophytes were removed.   I then gained femoral exposure taking care to protect the abductors and greater trochanter. This was performed using standard external rotation, extension, and adduction. The capsule was peeled off the inner aspect of the greater trochanter, taking care to preserve the short external rotators. A cookie cutter was used to enter the femoral canal, and then the femoral canal finder was placed. Sequential broaching was performed up to a size 4. Calcar planer was used on the femoral neck remnant. I placed a hi offset neck and a trial head ball. The hip was reduced. Leg lengths and offset were checked fluoroscopically. The hip was dislocated and trial components were removed. The final implants were placed, and the hip was reduced.  Fluoroscopy was used to confirm component position and leg lengths. At 90 degrees of external rotation and full extension, the hip was stable to an anterior directed force.  The wound was copiously irrigated with normal saline using pulse lavage. Marcaine solution was injected into the periarticular soft tissue. The wound was closed in layers using #1 Vicryl and V-Loc for the fascia, 2-0 Vicryl for the subcutaneous fat, 2-0 Monocryl for the deep dermal layer, 3-0 running Monocryl subcuticular stitch, and Dermabond for the skin. Once the glue was fully dried, an Aquacell Ag dressing was applied. The patient was transported to the  recovery room in stable condition. Sponge, needle, and instrument counts were correct at the end of the case x2. The patient tolerated the procedure well and there were no known complications.  Please note that a  surgical assistant was a medical necessity for this procedure to perform it in a safe and expeditious manner. Assistant was necessary to provide appropriate retraction of vital neurovascular structures, to prevent femoral fracture, and to allow for anatomic placement of the prosthesis.

## 2017-05-11 NOTE — Anesthesia Procedure Notes (Signed)
Procedure Name: MAC Date/Time: 05/11/2017 1:19 PM Performed by: Lissa Morales, CRNA Pre-anesthesia Checklist: Patient identified, Emergency Drugs available, Suction available, Patient being monitored and Timeout performed Patient Re-evaluated:Patient Re-evaluated prior to induction Oxygen Delivery Method: Simple face mask Placement Confirmation: positive ETCO2

## 2017-05-11 NOTE — Discharge Instructions (Signed)
°Dr. Marieann Zipp °Joint Replacement Specialist °[facility] °[address] °[place], [place] [address] °[phone] ° ° °TOTAL HIP REPLACEMENT POSTOPERATIVE DIRECTIONS ° ° ° °Hip Rehabilitation, Guidelines Following Surgery  ° °WEIGHT BEARING °Weight bearing as tolerated with assist device (walker, cane, etc) as directed, use it as long as suggested by your surgeon or therapist, typically at least 4-6 weeks. ° °The results of a hip operation are greatly improved after range of motion and muscle strengthening exercises. Follow all safety measures which are given to protect your hip. If any of these exercises cause increased pain or swelling in your joint, decrease the amount until you are comfortable again. Then slowly increase the exercises. Call your caregiver if you have problems or questions.  ° °HOME CARE INSTRUCTIONS  °Most of the following instructions are designed to prevent the dislocation of your new hip.  °Remove items at home which could result in a fall. This includes throw rugs or furniture in walking pathways.  °Continue medications as instructed at time of discharge. °· You may have some home medications which will be placed on hold until you complete the course of blood thinner medication. °· You may start showering once you are discharged home. Do not remove your dressing. °Do not put on socks or shoes without following the instructions of your caregivers.   °Sit on chairs with arms. Use the chair arms to help push yourself up when arising.  °Arrange for the use of a toilet seat elevator so you are not sitting low.  °· Walk with walker as instructed.  °You may resume a sexual relationship in one month or when given the OK by your caregiver.  °Use walker as long as suggested by your caregivers.  °You may put full weight on your legs and walk as much as is comfortable. °Avoid periods of inactivity such as sitting longer than an hour when not asleep. This helps prevent  blood clots.  °You may return to work once you are cleared by your surgeon.  °Do not drive a car for 6 weeks or until released by your surgeon.  °Do not drive while taking narcotics.  °Wear elastic stockings for two weeks following surgery during the day but you may remove then at night.  °Make sure you keep all of your appointments after your operation with all of your doctors and caregivers. You should call the office at the above phone number and make an appointment for approximately two weeks after the date of your surgery. °Please pick up a stool softener and laxative for home use as long as you are requiring pain medications. °· ICE to the affected hip every three hours for 30 minutes at a time and then as needed for pain and swelling. Continue to use ice on the hip for pain and swelling from surgery. You may notice swelling that will progress down to the foot and ankle.  This is normal after surgery.  Elevate the leg when you are not up walking on it.   °It is important for you to complete the blood thinner medication as prescribed by your doctor. °· Continue to use the breathing machine which will help keep your temperature down.  It is common for your temperature to cycle up and down following surgery, especially at night when you are not up moving around and exerting yourself.  The breathing machine keeps your lungs expanded and your temperature down. ° °RANGE OF MOTION AND STRENGTHENING EXERCISES  °These exercises are   designed to help you keep full movement of your hip joint. Follow your caregiver's or physical therapist's instructions. Perform all exercises about fifteen times, three times per day or as directed. Exercise both hips, even if you have had only one joint replacement. These exercises can be done on a training (exercise) mat, on the floor, on a table or on a bed. Use whatever works the best and is most comfortable for you. Use music or television while you are exercising so that the exercises  are a pleasant break in your day. This will make your life better with the exercises acting as a break in routine you can look forward to.  °Lying on your back, slowly slide your foot toward your buttocks, raising your knee up off the floor. Then slowly slide your foot back down until your leg is straight again.  °Lying on your back spread your legs as far apart as you can without causing discomfort.  °Lying on your side, raise your upper leg and foot straight up from the floor as far as is comfortable. Slowly lower the leg and repeat.  °Lying on your back, tighten up the muscle in the front of your thigh (quadriceps muscles). You can do this by keeping your leg straight and trying to raise your heel off the floor. This helps strengthen the largest muscle supporting your knee.  °Lying on your back, tighten up the muscles of your buttocks both with the legs straight and with the knee bent at a comfortable angle while keeping your heel on the floor.  ° °SKILLED REHAB INSTRUCTIONS: °If the patient is transferred to a skilled rehab facility following release from the hospital, a list of the current medications will be sent to the facility for the patient to continue.  When discharged from the skilled rehab facility, please have the facility set up the patient's Home Health Physical Therapy prior to being released. Also, the skilled facility will be responsible for providing the patient with their medications at time of release from the facility to include their pain medication and their blood thinner medication. If the patient is still at the rehab facility at time of the two week follow up appointment, the skilled rehab facility will also need to assist the patient in arranging follow up appointment in our office and any transportation needs. ° °MAKE SURE YOU:  °Understand these instructions.  °Will watch your condition.  °Will get help right away if you are not doing well or get worse. ° °Pick up stool softner and  laxative for home use following surgery while on pain medications. °Do not remove your dressing. °The dressing is waterproof--it is OK to take showers. °Continue to use ice for pain and swelling after surgery. °Do not use any lotions or creams on the incision until instructed by your surgeon. °Total Hip Protocol. ° ° °

## 2017-05-12 ENCOUNTER — Other Ambulatory Visit: Payer: Self-pay

## 2017-05-12 LAB — CBC
HCT: 36.3 % — ABNORMAL LOW (ref 39.0–52.0)
Hemoglobin: 12.6 g/dL — ABNORMAL LOW (ref 13.0–17.0)
MCH: 32.6 pg (ref 26.0–34.0)
MCHC: 34.7 g/dL (ref 30.0–36.0)
MCV: 94 fL (ref 78.0–100.0)
Platelets: 234 10*3/uL (ref 150–400)
RBC: 3.86 MIL/uL — ABNORMAL LOW (ref 4.22–5.81)
RDW: 12.8 % (ref 11.5–15.5)
WBC: 11.3 10*3/uL — AB (ref 4.0–10.5)

## 2017-05-12 LAB — BASIC METABOLIC PANEL
ANION GAP: 6 (ref 5–15)
BUN: 9 mg/dL (ref 6–20)
CALCIUM: 8.9 mg/dL (ref 8.9–10.3)
CO2: 27 mmol/L (ref 22–32)
Chloride: 104 mmol/L (ref 101–111)
Creatinine, Ser: 0.86 mg/dL (ref 0.61–1.24)
GFR calc non Af Amer: >60 mL/min (ref >60)
Glucose, Bld: 149 mg/dL — ABNORMAL HIGH (ref 65–99)
Potassium: 4.2 mmol/L (ref 3.5–5.1)
SODIUM: 137 mmol/L (ref 135–145)

## 2017-05-12 MED ORDER — LIP MEDEX EX OINT
TOPICAL_OINTMENT | CUTANEOUS | Status: AC
  Filled 2017-05-12: qty 7

## 2017-05-12 MED ORDER — DOCUSATE SODIUM 100 MG PO CAPS: 100.0000 mg | ORAL_CAPSULE | Freq: Two times a day (BID) | ORAL | 1 refills | Status: DC

## 2017-05-12 MED ORDER — ASPIRIN 81 MG PO CHEW: 81.0000 mg | CHEWABLE_TABLET | Freq: Two times a day (BID) | ORAL | 1 refills | Status: AC

## 2017-05-12 MED ORDER — SENNA 8.6 MG PO TABS: 2.0000 | ORAL_TABLET | Freq: Every day | ORAL | 3 refills | Status: DC

## 2017-05-12 MED ORDER — HYDROCODONE-ACETAMINOPHEN 5-325 MG PO TABS
1.0000 | ORAL_TABLET | ORAL | 0 refills | Status: AC | PRN
Start: 2017-05-12 — End: ?

## 2017-05-12 MED ORDER — ONDANSETRON HCL 4 MG PO TABS: 4.0000 mg | ORAL_TABLET | Freq: Four times a day (QID) | ORAL | 0 refills | Status: DC | PRN

## 2017-05-12 NOTE — Anesthesia Postprocedure Evaluation (Signed)
Anesthesia Post Note  Patient: Dwayne Kramer  Procedure(s) Performed: RIGHT TOTAL HIP ARTHROPLASTY ANTERIOR APPROACH (Right Hip)     Patient location during evaluation: PACU Anesthesia Type: Spinal Level of consciousness: oriented and awake and alert Pain management: pain level controlled Vital Signs Assessment: post-procedure vital signs reviewed and stable Respiratory status: spontaneous breathing, respiratory function stable and patient connected to nasal cannula oxygen Cardiovascular status: blood pressure returned to baseline and stable Postop Assessment: no headache, no backache and no apparent nausea or vomiting Anesthetic complications: no    Last Vitals:  Vitals:   05/12/17 0547 05/12/17 0947  BP: 124/72   Pulse: 89   Resp: 14   Temp: 36.5 C   SpO2: 98% 98%    Last Pain:  Vitals:   05/12/17 0947  TempSrc:   PainSc: 5                  Florentine Diekman S

## 2017-05-12 NOTE — Evaluation (Signed)
Physical Therapy One Time Evaluation Patient Details Name: Dwayne Kramer MRN: [id number] DOB: 1990-05-30 Today's Date: 05/12/2017   History of Present Illness  Pt is a 27 year old male s/p Right total hip arthroplasty, anterior approach due to avascular necrosis  Clinical Impression  Patient evaluated by Physical Therapy with no further acute PT needs identified. All education has been completed and the patient has no further questions.  Pt ambulated in hallway and performed LE exercises. Pt mobilizing well and anticipates d/c home today.  Provided HEP handout and pt had no further questions. See below for any follow-up Physical Therapy or equipment needs. PT is signing off. Thank you for this referral.     Follow Up Recommendations No PT follow up    Equipment Recommendations  Rolling walker with 5" wheels    Recommendations for Other Services       Precautions / Restrictions Precautions Precautions: None Restrictions Other Position/Activity Restrictions: WBAT      Mobility  Bed Mobility Overal bed mobility: Modified Independent                Transfers Overall transfer level: Needs assistance Equipment used: Rolling walker (2 wheeled) Transfers: Sit to/from Stand Sit to Stand: Min guard;Supervision         General transfer comment: verbal cues for safe technique  Ambulation/Gait Ambulation/Gait assistance: Min guard;Supervision Ambulation Distance (Feet): 160 Feet Assistive device: Rolling walker (2 wheeled) Gait Pattern/deviations: Step-through pattern;Decreased stance time - right;Antalgic     General Gait Details: verbal cues for RW positioning, posture  Stairs            Wheelchair Mobility    Modified Rankin (Stroke Patients Only)       Balance                                             Pertinent Vitals/Pain Pain Assessment: 0-10 Pain Score: 3  Pain Location: R hip Pain Descriptors / Indicators:  Aching;Sore Pain Intervention(s): Limited activity within patient's tolerance;Repositioned;Monitored during session;RN gave pain meds during session    Home Living Family/patient expects to be discharged to:: Private residence Living Arrangements: Spouse/significant other   Type of Home: House Home Access: Level entry     Home Layout: One level Home Equipment: None      Prior Function Level of Independence: Independent               Hand Dominance        Extremity/Trunk Assessment        Lower Extremity Assessment Lower Extremity Assessment: RLE deficits/detail RLE Deficits / Details: able to perform full AROM of hip, likely some anticipated post op weakness however did not MMT       Communication   Communication: No difficulties  Cognition Arousal/Alertness: Awake/alert Behavior During Therapy: WFL for tasks assessed/performed Overall Cognitive Status: Within Functional Limits for tasks assessed                                        General Comments      Exercises Total Joint Exercises Ankle Circles/Pumps: AROM;10 reps;Standing;Both(heel raises) Hip ABduction/ADduction: AROM;10 reps;Standing;Right Long Arc Quad: AROM;10 reps;Seated;Right Knee Flexion: AROM;10 reps;Right;Standing Marching in Standing: AROM;10 reps;Right;Standing Standing Hip Extension: AROM;10 reps;Right;Standing   Assessment/Plan    PT  Assessment Patent does not need any further PT services  PT Problem List         PT Treatment Interventions      PT Goals (Current goals can be found in the Care Plan section)  Acute Rehab PT Goals PT Goal Formulation: All assessment and education complete, DC therapy    Frequency     Barriers to discharge        Co-evaluation               AM-PAC PT "6 Clicks" Daily Activity  Outcome Measure Difficulty turning over in bed (including adjusting bedclothes, sheets and blankets)?: None Difficulty moving from lying  on back to sitting on the side of the bed? : A Little Difficulty sitting down on and standing up from a chair with arms (e.g., wheelchair, bedside commode, etc,.)?: A Little Help needed moving to and from a bed to chair (including a wheelchair)?: A Little Help needed walking in hospital room?: A Little Help needed climbing 3-5 steps with a railing? : A Little 6 Click Score: 19    End of Session   Activity Tolerance: Patient tolerated treatment well Patient left: in bed;with nursing/sitter in room;with family/visitor present;with call bell/phone within reach   PT Visit Diagnosis: Other abnormalities of gait and mobility (R26.89)    Time: 1610-9604 PT Time Calculation (min) (ACUTE ONLY): 16 min   Charges:   PT Evaluation $PT Eval Low Complexity: 1 Low     PT G CodesZenovia Jarred, PT, DPT 05/12/2017 Pager: [phone]  Maida Sale E 05/12/2017, 12:49 PM

## 2017-05-12 NOTE — Discharge Summary (Signed)
Physician Discharge Summary  Patient ID: Dwayne Kramer MRN: [id number] DOB/AGE: Jan 11, 1991 27 y.o.  Admit date: 05/11/2017 Discharge date: 05/12/2017  Admission Diagnoses:  Avascular necrosis of hip, right [facility](HCC)  Discharge Diagnoses:  Principal Problem:   Avascular necrosis of hip, right [facility](HCC)   Past Medical History:  Diagnosis Date  . Arthritis     Surgeries: Procedure(s): RIGHT TOTAL HIP ARTHROPLASTY ANTERIOR APPROACH on 05/11/2017   Consultants (if any):   Discharged Condition: Improved  Hospital Course: Dwayne MoWilliam Reinhold is an 27 y.o. male who was admitted 05/11/2017 with a diagnosis of Avascular necrosis of hip, right (HCC) and went to the operating room on 05/11/2017 and underwent the above named procedures.    He was given perioperative antibiotics:  Anti-infectives (From admission, onward)   Start     Dose/Rate Route Frequency Ordered Stop   05/12/17 0600  ceFAZolin (ANCEF) IVPB 2g/100 mL premix     2 g 200 mL/hr over 30 Minutes Intravenous On call to O.R. 05/11/17 1205 05/11/17 1338   05/11/17 2000  ceFAZolin (ANCEF) IVPB 2g/100 mL premix     2 g 200 mL/hr over 30 Minutes Intravenous Every 6 hours 05/11/17 1733 05/12/17 0236    .  He was given sequential compression devices, early ambulation, and ASA for DVT prophylaxis.  He benefited maximally from the hospital stay and there were no complications.    Recent vital signs:  Vitals:   05/12/17 0547 05/12/17 0947  BP: 124/72   Pulse: 89   Resp: 14   Temp: 97.7 F (36.5 C)   SpO2: 98% 98%    Recent laboratory studies:  Lab Results  Component Value Date   HGB 12.6 (L) 05/12/2017   HGB 15.3 05/09/2017   Lab Results  Component Value Date   WBC 11.3 (H) 05/12/2017   PLT 234 05/12/2017   No results found for: INR Lab Results  Component Value Date   NA 137 05/12/2017   K 4.2 05/12/2017   CL 104 05/12/2017   CO2 27 05/12/2017   BUN 9 05/12/2017   CREATININE 0.86 05/12/2017   GLUCOSE 149 (H)  05/12/2017    Discharge Medications:   Allergies as of 05/12/2017   No Known Allergies     Medication List    STOP taking these medications   diclofenac sodium 1 % Gel Commonly known as:  VOLTAREN     TAKE these medications   aspirin 81 MG chewable tablet Chew 1 tablet (81 mg total) by mouth 2 (two) times daily.   cyclobenzaprine 10 MG tablet Commonly known as:  FLEXERIL Take 10 mg by mouth 2 (two) times daily.   docusate sodium 100 MG capsule Commonly known as:  COLACE Take 1 capsule (100 mg total) by mouth 2 (two) times daily.   gabapentin 300 MG capsule Commonly known as:  NEURONTIN Take 300 mg by mouth at bedtime.   HYDROcodone-acetaminophen 5-325 MG tablet Commonly known as:  NORCO/VICODIN Take 1-2 tablets by mouth every 4 (four) hours as needed.   ibuprofen 800 MG tablet Commonly known as:  ADVIL,MOTRIN Take 800 mg by mouth every 8 (eight) hours as needed for headache or moderate pain.   ondansetron 4 MG tablet Commonly known as:  ZOFRAN Take 1 tablet (4 mg total) by mouth every 6 (six) hours as needed for nausea.   senna 8.6 MG Tabs tablet Commonly known as:  SENOKOT Take 2 tablets (17.2 mg total) by mouth at bedtime.       Diagnostic Studies:  Dg Pelvis Portable  Result Date: 05/11/2017 CLINICAL DATA:  Postop hip replacement EXAM: PORTABLE PELVIS 1-2 VIEWS COMPARISON:  None. FINDINGS: Sclerosis and probable cysts in the left humeral head. Pubic symphysis and rami are intact. Status post right hip replacement with normal alignment. Gas in the soft tissues of the proximal thigh. IMPRESSION: Status post right hip replacement with normal alignment. Gas in the soft tissues of the proximal thigh consistent with recent surgery. Sclerosis and probable cystic change in the left humeral head. Electronically Signed   By: Jasmine Pang M.D.   On: 05/11/2017 16:40   Dg C-arm 1-60 Min-no Report  Result Date: 05/11/2017 Fluoroscopy was utilized by the requesting  physician.  No radiographic interpretation.   Dg Hip Operative Unilat W Or W/o Pelvis Right  Result Date: 05/11/2017 CLINICAL DATA:  Status post right hip replacement. EXAM: OPERATIVE right HIP (WITH PELVIS IF PERFORMED) 4 VIEWS TECHNIQUE: Fluoroscopic spot image(s) were submitted for interpretation post-operatively. Radiation exposure index: 1.21 mGy. COMPARISON:  None. FINDINGS: Four intraoperative fluoroscopic images were obtained of the right hip. The right hip prosthesis appears to be well situated. Expected postoperative changes are seen in the surrounding soft tissues. IMPRESSION: Status post right hip arthroplasty. Electronically Signed   By: Lupita Raider, M.D.   On: 05/11/2017 15:18    Disposition:    Discharge Instructions    Call MD / Call 911   Complete by:  As directed    If you experience chest pain or shortness of breath, CALL 911 and be transported to the hospital emergency room.  If you develope a fever above 101 F, pus (white drainage) or increased drainage or redness at the wound, or calf pain, call your surgeon's office.   Constipation Prevention   Complete by:  As directed    Drink plenty of fluids.  Prune juice may be helpful.  You may use a stool softener, such as Colace (over the counter) 100 mg twice a day.  Use MiraLax (over the counter) for constipation as needed.   Diet - low sodium heart healthy   Complete by:  As directed    Driving restrictions   Complete by:  As directed    No driving for 6 weeks   Increase activity slowly as tolerated   Complete by:  As directed    Lifting restrictions   Complete by:  As directed    No lifting for 6 weeks   TED hose   Complete by:  As directed    Use stockings (TED hose) for 2 weeks on both leg(s).  You may remove them at night for sleeping.      Follow-up Information    Ilee Randleman, Arlys John, MD. Schedule an appointment as soon as possible for a visit in 2 weeks.   Specialty:  Orthopedic Surgery Why:  For wound  re-check Contact information: [address] [place] [address] [phone]            Signed: Iline Oven Ioannis Schuh 05/12/2017, 7:08 PM

## 2017-05-12 NOTE — Progress Notes (Signed)
    Subjective:  Patient reports pain as mild to moderate.  Denies N/V/CP/SOB.   Objective:   VITALS:   Vitals:   05/11/17 1944 05/11/17 2044 05/12/17 0209 05/12/17 0547  BP: (!) 147/82 134/76 137/70 124/72  Pulse: 99 91 88 89  Resp: 18 14 17 14   Temp: 98.2 F (36.8 C) 97.9 F (36.6 C) 97.8 F (36.6 C) 97.7 F (36.5 C)  TempSrc:  Axillary Oral Oral  SpO2: 100% 100% 99% 98%  Weight:      Height:        NAD ABD soft Sensation intact distally Intact pulses distally Dorsiflexion/Plantar flexion intact Incision: dressing C/D/I Compartment soft   Lab Results  Component Value Date   WBC 11.3 (H) 05/12/2017   HGB 12.6 (L) 05/12/2017   HCT 36.3 (L) 05/12/2017   MCV 94.0 05/12/2017   PLT 234 05/12/2017   BMET    Component Value Date/Time   NA 137 05/12/2017 0521   K 4.2 05/12/2017 0521   CL 104 05/12/2017 0521   CO2 27 05/12/2017 0521   GLUCOSE 149 (H) 05/12/2017 0521   BUN 9 05/12/2017 0521   CREATININE 0.86 05/12/2017 0521   CALCIUM 8.9 05/12/2017 0521   GFRNONAA >60 05/12/2017 0521   GFRAA >60 05/12/2017 0521     Assessment/Plan: 1 Day Post-Op   Principal Problem:   Avascular necrosis of hip, right (HCC)   WBAT with walker DVT ppx: ASA, SCDs, TEDS PO pain control PT/OT Dispo: D/C home with HEP    Iline OvenBrian J Jonatha Gagen 05/12/2017, 7:33 AM   Samson FredericBrian Kayshawn Ozburn, MD Cell [phone]

## 2017-05-15 ENCOUNTER — Encounter (HOSPITAL_COMMUNITY): Payer: Self-pay | Admitting: Orthopedic Surgery

## 2017-08-14 NOTE — Pre-Procedure Instructions (Signed)
Dwayne Kramer  08/14/2017     No Pharmacies Listed   Your procedure is scheduled on Monday July 8.  Report to [facility] Admitting at 11:30 A.M.  Call this number if you have problems the morning of surgery:  [phone]   Remember:  Do not eat or drink after midnight.    Take these medicines the morning of surgery with A SIP OF WATER: Flexeril if needed  7 days prior to surgery STOP taking any Aspirin(unless otherwise instructed by your surgeon), Aleve, Naproxen, Ibuprofen, Motrin, Advil, Goody's, BC's, all herbal medications, fish oil, and all vitamins    Do not wear jewelry, make-up or nail polish.  Do not wear lotions, powders, or perfumes, or deodorant.  Do not shave 48 hours prior to surgery.  Men may shave face and neck.  Do not bring valuables to the hospital.  [facility] is not responsible for any belongings or valuables.  Contacts, dentures or bridgework may not be worn into surgery.  Leave your suitcase in the car.  After surgery it may be brought to your room.  For patients admitted to the hospital, discharge time will be determined by your treatment team.  Patients discharged the day of surgery will not be allowed to drive home.   Special instructions:    [place]- Preparing For Surgery  Before surgery, you can play an important role. Because skin is not sterile, your skin needs to be as free of germs as possible. You can reduce the number of germs on your skin by washing with CHG (chlorahexidine gluconate) Soap before surgery.  CHG is an antiseptic cleaner which kills germs and bonds with the skin to continue killing germs even after washing.    Oral Hygiene is also important to reduce your risk of infection.  Remember - BRUSH YOUR TEETH THE MORNING OF SURGERY WITH YOUR REGULAR TOOTHPASTE  Please do not use if you have an allergy to CHG or antibacterial soaps. If your skin becomes reddened/irritated stop using the CHG.  Do not shave (including  legs and underarms) for at least 48 hours prior to first CHG shower. It is OK to shave your face.  Please follow these instructions carefully.   1. Shower the NIGHT BEFORE SURGERY and the MORNING OF SURGERY with CHG.   2. If you chose to wash your hair, wash your hair first as usual with your normal shampoo.  3. After you shampoo, rinse your hair and body thoroughly to remove the shampoo.  4. Use CHG as you would any other liquid soap. You can apply CHG directly to the skin and wash gently with a scrungie or a clean washcloth.   5. Apply the CHG Soap to your body ONLY FROM THE NECK DOWN.  Do not use on open wounds or open sores. Avoid contact with your eyes, ears, mouth and genitals (private parts). Wash Face and genitals (private parts)  with your normal soap.  6. Wash thoroughly, paying special attention to the area where your surgery will be performed.  7. Thoroughly rinse your body with warm water from the neck down.  8. DO NOT shower/wash with your normal soap after using and rinsing off the CHG Soap.  9. Pat yourself dry with a CLEAN TOWEL.  10. Wear CLEAN PAJAMAS to bed the night before surgery, wear comfortable clothes the morning of surgery  11. Place CLEAN SHEETS on your bed the night of your first shower and DO NOT SLEEP WITH  PETS.    Day of Surgery:  Do not apply any deodorants/lotions.  Please wear clean clothes to the hospital/surgery center.   Remember to brush your teeth WITH YOUR REGULAR TOOTHPASTE.    Please read over the following fact sheets that you were given. Coughing and Deep Breathing, Total Joint Packet, MRSA Information and Surgical Site Infection Prevention

## 2017-08-15 ENCOUNTER — Other Ambulatory Visit: Payer: Self-pay

## 2017-08-15 ENCOUNTER — Ambulatory Visit: Payer: Self-pay | Admitting: Orthopedic Surgery

## 2017-08-15 ENCOUNTER — Encounter (HOSPITAL_COMMUNITY): Payer: Self-pay

## 2017-08-15 ENCOUNTER — Encounter (HOSPITAL_COMMUNITY)
Admission: RE | Admit: 2017-08-15 | Discharge: 2017-08-15 | Disposition: A | Discharge status: Home / Self Care | Payer: No Typology Code available for payment source | Source: Ambulatory Visit | Attending: Orthopedic Surgery | Admitting: Orthopedic Surgery

## 2017-08-15 DIAGNOSIS — Z01812 Encounter for preprocedural laboratory examination: Secondary | ICD-10-CM | POA: Diagnosis not present

## 2017-08-15 HISTORY — DX: Cardiac murmur, unspecified: R01.1

## 2017-08-15 LAB — BASIC METABOLIC PANEL
Anion gap: 10 (ref 5–15)
BUN: 10 mg/dL (ref 6–20)
CALCIUM: 9.5 mg/dL (ref 8.9–10.3)
CHLORIDE: 103 mmol/L (ref 98–111)
CO2: 25 mmol/L (ref 22–32)
CREATININE: 1.01 mg/dL (ref 0.61–1.24)
GFR calc non Af Amer: >60 mL/min (ref >60)
Glucose, Bld: 107 mg/dL — ABNORMAL HIGH (ref 70–99)
Potassium: 4.5 mmol/L (ref 3.5–5.1)
SODIUM: 138 mmol/L (ref 135–145)

## 2017-08-15 LAB — CBC
HCT: 45.8 % (ref 39.0–52.0)
Hemoglobin: 15.1 g/dL (ref 13.0–17.0)
MCH: 31.3 pg (ref 26.0–34.0)
MCHC: 33 g/dL (ref 30.0–36.0)
MCV: 94.8 fL (ref 78.0–100.0)
Platelets: 298 10*3/uL (ref 150–400)
RBC: 4.83 MIL/uL (ref 4.22–5.81)
RDW: 12.8 % (ref 11.5–15.5)
WBC: 5.4 10*3/uL (ref 4.0–10.5)

## 2017-08-15 LAB — SURGICAL PCR SCREEN
MRSA, PCR: NEGATIVE
STAPHYLOCOCCUS AUREUS: POSITIVE — AB

## 2017-08-15 NOTE — H&P (Signed)
TOTAL HIP ADMISSION H&P  Patient is admitted for left total hip arthroplasty.  Subjective:  Chief Complaint: left hip pain  HPI: Dwayne Kramer, 27 y.o. male, has a history of pain and functional disability in the left hip(s) due to AVN and patient has failed non-surgical conservative treatments for greater than 12 weeks to include NSAID's and/or analgesics, flexibility and strengthening excercises, use of assistive devices, weight reduction as appropriate and activity modification.  Onset of symptoms was gradual starting 2 years ago with gradually worsening course since that time.The patient noted no past surgery on the left hip(s).  Patient currently rates pain in the left hip at 10 out of 10 with activity. Patient has night pain, worsening of pain with activity and weight bearing, pain that interfers with activities of daily living and pain with passive range of motion. Patient has evidence of AVN by imaging studies. This condition presents safety issues increasing the risk of falls.  There is no current active infection.  Patient Active Problem List   Diagnosis Date Noted  . Avascular necrosis of hip, right (HCC) 05/11/2017   Past Medical History:  Diagnosis Date  . Arthritis   . Heart murmur    as infant  resolved    Past Surgical History:  Procedure Laterality Date  . TONSILLECTOMY    . TOTAL HIP ARTHROPLASTY Right 05/11/2017   Procedure: RIGHT TOTAL HIP ARTHROPLASTY ANTERIOR APPROACH;  Surgeon: Samson Frederic, MD;  Location: [facility];  Service: Orthopedics;  Laterality: Right;  . WISDOM TOOTH EXTRACTION      Current Outpatient Medications  Medication Sig Dispense Refill Last Dose  . amitriptyline (ELAVIL) 10 MG tablet Take 10 mg by mouth at bedtime.     Marland Kitchen aspirin 81 MG chewable tablet Chew 1 tablet (81 mg total) by mouth 2 (two) times daily. (Patient not taking: Reported on 08/14/2017) 60 tablet 1 Not Taking at Unknown time  . cyclobenzaprine (FLEXERIL) 10 MG tablet Take 10 mg by  mouth at bedtime.    Past Month at Unknown time  . diclofenac sodium (VOLTAREN) 1 % GEL Apply 2 g topically daily as needed (knee pain).      Marland Kitchen docusate sodium (COLACE) 100 MG capsule Take 1 capsule (100 mg total) by mouth 2 (two) times daily. (Patient not taking: Reported on 08/14/2017) 60 capsule 1 Not Taking at Unknown time  . gabapentin (NEURONTIN) 300 MG capsule Take 300 mg by mouth at bedtime.   Past Month at Unknown time  . HYDROcodone-acetaminophen (NORCO/VICODIN) 5-325 MG tablet Take 1-2 tablets by mouth every 4 (four) hours as needed. (Patient not taking: Reported on 08/14/2017) 50 tablet 0 Not Taking at Unknown time  . naproxen (NAPROSYN) 500 MG tablet Take 500 mg by mouth 2 (two) times daily with a meal.     . ondansetron (ZOFRAN) 4 MG tablet Take 1 tablet (4 mg total) by mouth every 6 (six) hours as needed for nausea. (Patient not taking: Reported on 08/14/2017) 20 tablet 0 Not Taking at Unknown time  . promethazine (PHENERGAN) 25 MG tablet Take 25 mg by mouth 2 (two) times daily.     Marland Kitchen senna (SENOKOT) 8.6 MG TABS tablet Take 2 tablets (17.2 mg total) by mouth at bedtime. (Patient not taking: Reported on 08/14/2017) 60 each 3 Not Taking at Unknown time  . SUMAtriptan (IMITREX) 100 MG tablet Take 100 mg by mouth every 2 (two) hours as needed for migraine. May repeat in 2 hours if headache persists or recurs.  No current facility-administered medications for this visit.    No Known Allergies  Social History   Tobacco Use  . Smoking status: Never Smoker  . Smokeless tobacco: Never Used  Substance Use Topics  . Alcohol use: Yes    Comment: occassionally    No family history on file.   Review of Systems  Constitutional: Negative.   HENT: Negative.   Eyes: Negative.   Respiratory: Negative.   Cardiovascular: Negative.   Gastrointestinal: Negative.   Genitourinary: Negative.   Musculoskeletal: Positive for back pain and joint pain.  Skin: Negative.   Neurological: Negative.    Endo/Heme/Allergies: Negative.   Psychiatric/Behavioral: Negative.     Objective:  Physical Exam  Vitals reviewed. Constitutional: He is oriented to person, place, and time. He appears well-developed and well-nourished.  HENT:  Head: Normocephalic and atraumatic.  Eyes: Pupils are equal, round, and reactive to light. Conjunctivae and EOM are normal.  Neck: Normal range of motion. Neck supple.  Cardiovascular: Normal rate, regular rhythm and intact distal pulses.  Respiratory: No respiratory distress.  GI: Soft. He exhibits no distension.  Genitourinary:  Genitourinary Comments: deferred  Musculoskeletal:       Left hip: He exhibits decreased range of motion and tenderness.  Neurological: He is alert and oriented to person, place, and time. He has normal reflexes.  Skin: Skin is warm and dry.  Psychiatric: He has a normal mood and affect. His behavior is normal. Judgment and thought content normal.    Vital signs in last 24 hours: @VSRANGES @  Labs:   Estimated body mass index is 30.46 kg/m as calculated from the following:   Height as of an earlier encounter on 08/15/17: 5\' 7"  (1.702 m).   Weight as of an earlier encounter on 08/15/17: 88.2 kg (194 lb 8 oz).   Imaging Review Plain radiographs demonstrate AVN of the left hip(s). The bone quality appears to be adequate for age and reported activity level.    Preoperative templating of the joint replacement has been completed, documented, and submitted to the Operating Room personnel in order to optimize intra-operative equipment management.     Assessment/Plan:  AVN, left hip(s)  The patient history, physical examination, clinical judgement of the provider and imaging studies are consistent with end stage degenerative joint disease of the left hip(s) and total hip arthroplasty is deemed medically necessary. The treatment options including medical management, injection therapy, arthroscopy and arthroplasty were discussed  at length. The risks and benefits of total hip arthroplasty were presented and reviewed. The risks due to aseptic loosening, infection, stiffness, dislocation/subluxation,  thromboembolic complications and other imponderables were discussed.  The patient acknowledged the explanation, agreed to proceed with the plan and consent was signed. Patient is being admitted for inpatient treatment for surgery, pain control, PT, OT, prophylactic antibiotics, VTE prophylaxis, progressive ambulation and ADL's and discharge planning.The patient is planning to be discharged home with HEP

## 2017-08-15 NOTE — Pre-Procedure Instructions (Signed)
Dwayne Kramer  08/15/2017     No Pharmacies Listed   Your procedure is scheduled on Monday July 8.  [facility]  Call this number if you have problems the morning of surgery:  [phone]   Remember:  Do not eat or drink after midnight.    Take these medicines the morning of surgery with A SIP OF WATER: Flexeril if needed  7 days prior to surgery STOP taking any Aspirin(unless otherwise instructed by your surgeon), Aleve, Naproxen, Ibuprofen, Motrin, Advil, Goody's, BC's, all herbal medications, fish oil, and all vitamins    Do not wear jewelry, make-up or nail polish.  Do not wear lotions, powders, or perfumes, or deodorant.  Do not shave 48 hours prior to surgery.  Men may shave face and neck.  Do not bring valuables to the hospital.  [facility] or [facility]ables.  Contacts, dentures or bridgework may not be worn into surgery.  Leave your suitcase in the car.  After surgery it may be brought to your room.  For patients admitted to the hospital, discharge time will be determined by your treatment team.  Patients discharged the day of surgery will not be allowed to drive home.   Special instructions:    [facility]- Preparing For Surgery  Before surgery, you can play an important role. Because skin is not sterile, your skin needs to be as free of germs as possible. You can reduce the number of germs on your skin by washing with CHG (chlorahexidine gluconate) Soap before surgery.  CHG is an antiseptic cleaner which kills germs and bonds with the skin to continue killing germs even after washing.    Oral Hygiene is also important to reduce your risk of infection.  Remember - BRUSH YOUR TEETH THE MORNING OF SURGERY WITH YOUR REGULAR TOOTHPASTE  Please do not use if you have an allergy to CHG or antibacterial soaps. If your skin becomes reddened/irritated stop using the CHG.  Do not shave (including  legs and underarms) for at least 48 hours prior to first CHG shower. It is OK to shave your face.  Please follow these instructions carefully.   1. Shower the NIGHT BEFORE SURGERY and the MORNING OF SURGERY with CHG.   2. If you chose to wash your hair, wash your hair first as usual with your normal shampoo.  3. After you shampoo, rinse your hair and body thoroughly to remove the shampoo.  4. Use CHG as you would any other liquid soap. You can apply CHG directly to the skin and wash gently with a scrungie or a clean washcloth.   5. Apply the CHG Soap to your body ONLY FROM THE NECK DOWN.  Do not use on open wounds or open sores. Avoid contact with your eyes, ears, mouth and genitals (private parts). Wash Face and genitals (private parts)  with your normal soap.  6. Wash thoroughly, paying special attention to the area where your surgery will be performed.  7. Thoroughly rinse your body with warm water from the neck down.  8. DO NOT shower/wash with your normal soap after using and rinsing off the CHG Soap.  9. Pat yourself dry with a CLEAN TOWEL.  10. Wear CLEAN PAJAMAS to bed the night before surgery, wear comfortable clothes the morning of surgery  11. Place CLEAN SHEETS on your bed the night of your first shower and DO NOT SLEEP  WITH PETS.    Day of Surgery:  Do not apply any deodorants/lotions.  Please wear clean clothes to the hospital/surgery center.   Remember to brush your teeth WITH YOUR REGULAR TOOTHPASTE.    Please read over the following fact sheets that you were given. Coughing and Deep Breathing, Total Joint Packet, MRSA Information and Surgical Site Infection Prevention

## 2017-08-15 NOTE — H&P (View-Only) (Signed)
TOTAL HIP ADMISSION H&P  Patient is admitted for left total hip arthroplasty.  Subjective:  Chief Complaint: left hip pain  HPI: Dwayne Kramer, 27 y.o. male, has a history of pain and functional disability in the left hip(s) due to AVN and patient has failed non-surgical conservative treatments for greater than 12 weeks to include NSAID's and/or analgesics, flexibility and strengthening excercises, use of assistive devices, weight reduction as appropriate and activity modification.  Onset of symptoms was gradual starting 2 years ago with gradually worsening course since that time.The patient noted no past surgery on the left hip(s).  Patient currently rates pain in the left hip at 10 out of 10 with activity. Patient has night pain, worsening of pain with activity and weight bearing, pain that interfers with activities of daily living and pain with passive range of motion. Patient has evidence of AVN by imaging studies. This condition presents safety issues increasing the risk of falls.  There is no current active infection.  Patient Active Problem List   Diagnosis Date Noted  . Avascular necrosis of hip, right (HCC) 05/11/2017   Past Medical History:  Diagnosis Date  . Arthritis   . Heart murmur    as infant  resolved    Past Surgical History:  Procedure Laterality Date  . TONSILLECTOMY    . TOTAL HIP ARTHROPLASTY Right 05/11/2017   Procedure: RIGHT TOTAL HIP ARTHROPLASTY ANTERIOR APPROACH;  Surgeon: Lynita Groseclose, MD;  Location: [facility];  Service: Orthopedics;  Laterality: Right;  . WISDOM TOOTH EXTRACTION      Current Outpatient Medications  Medication Sig Dispense Refill Last Dose  . amitriptyline (ELAVIL) 10 MG tablet Take 10 mg by mouth at bedtime.     . aspirin 81 MG chewable tablet Chew 1 tablet (81 mg total) by mouth 2 (two) times daily. (Patient not taking: Reported on 08/14/2017) 60 tablet 1 Not Taking at Unknown time  . cyclobenzaprine (FLEXERIL) 10 MG tablet Take 10 mg by  mouth at bedtime.    Past Month at Unknown time  . diclofenac sodium (VOLTAREN) 1 % GEL Apply 2 g topically daily as needed (knee pain).      . docusate sodium (COLACE) 100 MG capsule Take 1 capsule (100 mg total) by mouth 2 (two) times daily. (Patient not taking: Reported on 08/14/2017) 60 capsule 1 Not Taking at Unknown time  . gabapentin (NEURONTIN) 300 MG capsule Take 300 mg by mouth at bedtime.   Past Month at Unknown time  . HYDROcodone-acetaminophen (NORCO/VICODIN) 5-325 MG tablet Take 1-2 tablets by mouth every 4 (four) hours as needed. (Patient not taking: Reported on 08/14/2017) 50 tablet 0 Not Taking at Unknown time  . naproxen (NAPROSYN) 500 MG tablet Take 500 mg by mouth 2 (two) times daily with a meal.     . ondansetron (ZOFRAN) 4 MG tablet Take 1 tablet (4 mg total) by mouth every 6 (six) hours as needed for nausea. (Patient not taking: Reported on 08/14/2017) 20 tablet 0 Not Taking at Unknown time  . promethazine (PHENERGAN) 25 MG tablet Take 25 mg by mouth 2 (two) times daily.     . senna (SENOKOT) 8.6 MG TABS tablet Take 2 tablets (17.2 mg total) by mouth at bedtime. (Patient not taking: Reported on 08/14/2017) 60 each 3 Not Taking at Unknown time  . SUMAtriptan (IMITREX) 100 MG tablet Take 100 mg by mouth every 2 (two) hours as needed for migraine. May repeat in 2 hours if headache persists or recurs.        No current facility-administered medications for this visit.    No Known Allergies  Social History   Tobacco Use  . Smoking status: Never Smoker  . Smokeless tobacco: Never Used  Substance Use Topics  . Alcohol use: Yes    Comment: occassionally    No family history on file.   Review of Systems  Constitutional: Negative.   HENT: Negative.   Eyes: Negative.   Respiratory: Negative.   Cardiovascular: Negative.   Gastrointestinal: Negative.   Genitourinary: Negative.   Musculoskeletal: Positive for back pain and joint pain.  Skin: Negative.   Neurological: Negative.    Endo/Heme/Allergies: Negative.   Psychiatric/Behavioral: Negative.     Objective:  Physical Exam  Vitals reviewed. Constitutional: He is oriented to person, place, and time. He appears well-developed and well-nourished.  HENT:  Head: Normocephalic and atraumatic.  Eyes: Pupils are equal, round, and reactive to light. Conjunctivae and EOM are normal.  Neck: Normal range of motion. Neck supple.  Cardiovascular: Normal rate, regular rhythm and intact distal pulses.  Respiratory: No respiratory distress.  GI: Soft. He exhibits no distension.  Genitourinary:  Genitourinary Comments: deferred  Musculoskeletal:       Left hip: He exhibits decreased range of motion and tenderness.  Neurological: He is alert and oriented to person, place, and time. He has normal reflexes.  Skin: Skin is warm and dry.  Psychiatric: He has a normal mood and affect. His behavior is normal. Judgment and thought content normal.    Vital signs in last 24 hours: @VSRANGES@  Labs:   Estimated body mass index is 30.46 kg/m as calculated from the following:   Height as of an earlier encounter on 08/15/17: 5' 7" (1.702 m).   Weight as of an earlier encounter on 08/15/17: 88.2 kg (194 lb 8 oz).   Imaging Review Plain radiographs demonstrate AVN of the left hip(s). The bone quality appears to be adequate for age and reported activity level.    Preoperative templating of the joint replacement has been completed, documented, and submitted to the Operating Room personnel in order to optimize intra-operative equipment management.     Assessment/Plan:  AVN, left hip(s)  The patient history, physical examination, clinical judgement of the provider and imaging studies are consistent with end stage degenerative joint disease of the left hip(s) and total hip arthroplasty is deemed medically necessary. The treatment options including medical management, injection therapy, arthroscopy and arthroplasty were discussed  at length. The risks and benefits of total hip arthroplasty were presented and reviewed. The risks due to aseptic loosening, infection, stiffness, dislocation/subluxation,  thromboembolic complications and other imponderables were discussed.  The patient acknowledged the explanation, agreed to proceed with the plan and consent was signed. Patient is being admitted for inpatient treatment for surgery, pain control, PT, OT, prophylactic antibiotics, VTE prophylaxis, progressive ambulation and ADL's and discharge planning.The patient is planning to be discharged home with HEP 

## 2017-08-17 MED ORDER — SODIUM CHLORIDE 0.9 % IV SOLN
1000.0000 mg | INTRAVENOUS | Status: AC
  Filled 2017-08-17: qty 10

## 2017-08-17 MED ORDER — CEFAZOLIN SODIUM-DEXTROSE 2-4 GM/100ML-% IV SOLN: 2.0000 g | INTRAVENOUS | Status: AC

## 2017-08-17 MED ORDER — ACETAMINOPHEN 10 MG/ML IV SOLN: 1000.0000 mg | INTRAVENOUS | Status: AC

## 2017-08-20 NOTE — Anesthesia Preprocedure Evaluation (Addendum)
Anesthesia Evaluation  Patient identified by MRN, date of birth, ID band Patient awake    Reviewed: Allergy & Precautions, NPO status , Patient's Chart, lab work & pertinent test results  Airway Mallampati: II  TM Distance: >3 FB Neck ROM: Full    Dental no notable dental hx.    Pulmonary neg pulmonary ROS,    Pulmonary exam normal breath sounds clear to auscultation       Cardiovascular negative cardio ROS Normal cardiovascular exam Rhythm:Regular Rate:Normal     Neuro/Psych negative neurological ROS  negative psych ROS   GI/Hepatic negative GI ROS, Neg liver ROS,   Endo/Other  negative endocrine ROS  Renal/GU negative Renal ROS  negative genitourinary   Musculoskeletal  (+) Arthritis , Osteoarthritis,    Abdominal   Peds negative pediatric ROS (+)  Hematology negative hematology ROS (+)   Anesthesia Other Findings   Reproductive/Obstetrics negative OB ROS                                                              Anesthesia Evaluation  Patient identified by MRN, date of birth, ID band Patient awake    Reviewed: Allergy & Precautions, NPO status , Patient's Chart, lab work & pertinent test results  Airway Mallampati: II  TM Distance: >3 FB Neck ROM: Full    Dental no notable dental hx.    Pulmonary neg pulmonary ROS,    Pulmonary exam normal breath sounds clear to auscultation       Cardiovascular negative cardio ROS Normal cardiovascular exam Rhythm:Regular Rate:Normal     Neuro/Psych negative neurological ROS  negative psych ROS   GI/Hepatic negative GI ROS, Neg liver ROS,   Endo/Other  negative endocrine ROS  Renal/GU negative Renal ROS  negative genitourinary   Musculoskeletal  (+) Arthritis , Osteoarthritis,    Abdominal   Peds negative pediatric ROS (+)  Hematology negative hematology ROS (+)   Anesthesia Other Findings   Reproductive/Obstetrics negative OB ROS                             Anesthesia Physical Anesthesia Plan  ASA: II  Anesthesia Plan: Spinal   Post-op Pain Management:    Induction: Intravenous  PONV Risk Score and Plan: 2 and Ondansetron, Dexamethasone and Treatment may vary due to age or medical condition  Airway Management Planned: Simple Face Mask  Additional Equipment:   Intra-op Plan:   Post-operative Plan:   Informed Consent: I have reviewed the patients History and Physical, chart, labs and discussed the procedure including the risks, benefits and alternatives for the proposed anesthesia with the patient or authorized representative who has indicated his/her understanding and acceptance.   Dental advisory given  Plan Discussed with: CRNA and Surgeon  Anesthesia Plan Comments:        Anesthesia Quick Evaluation  Anesthesia Physical  Anesthesia Plan  ASA: II  Anesthesia Plan: Spinal   Post-op Pain Management:    Induction:   PONV Risk Score and Plan: 2 and Ondansetron, Dexamethasone and Treatment may vary due to age or medical condition  Airway Management Planned: Simple Face Mask and Nasal Cannula  Additional Equipment:   Intra-op Plan:   Post-operative Plan:   Informed Consent: I have reviewed  the patients History and Physical, chart, labs and discussed the procedure including the risks, benefits and alternatives for the proposed anesthesia with the patient or authorized representative who has indicated his/her understanding and acceptance.   Dental advisory given  Plan Discussed with: CRNA, Surgeon and Anesthesiologist  Anesthesia Plan Comments:         Anesthesia Quick Evaluation

## 2017-08-22 ENCOUNTER — Ambulatory Visit: Payer: Self-pay | Admitting: Orthopedic Surgery

## 2017-09-01 MED ORDER — ACETAMINOPHEN 325 MG PO TABS: 325.0000 mg | ORAL_TABLET | ORAL | Status: DC | PRN

## 2017-09-01 MED ORDER — ACETAMINOPHEN 160 MG/5ML PO SOLN: 325.0000 mg | ORAL | Status: DC | PRN

## 2017-09-01 MED ORDER — MEPERIDINE HCL 50 MG/ML IJ SOLN: 6.2500 mg | INTRAMUSCULAR | Status: DC | PRN

## 2017-09-01 MED ORDER — TRANEXAMIC ACID 1000 MG/10ML IV SOLN
1000.0000 mg | INTRAVENOUS | Status: AC
  Administered 2017-09-04: 1000 mg via INTRAVENOUS
  Filled 2017-09-01: qty 1100

## 2017-09-01 MED ORDER — OXYCODONE HCL 5 MG PO TABS: 5.0000 mg | ORAL_TABLET | Freq: Once | ORAL | Status: DC | PRN

## 2017-09-01 MED ORDER — ONDANSETRON HCL 4 MG/2ML IJ SOLN: 4.0000 mg | Freq: Once | INTRAMUSCULAR | Status: DC | PRN

## 2017-09-01 MED ORDER — OXYCODONE HCL 5 MG/5ML PO SOLN: 5.0000 mg | Freq: Once | ORAL | Status: DC | PRN

## 2017-09-01 MED ORDER — FENTANYL CITRATE (PF) 100 MCG/2ML IJ SOLN: 25.0000 ug | INTRAMUSCULAR | Status: DC | PRN

## 2017-09-04 ENCOUNTER — Inpatient Hospital Stay (HOSPITAL_COMMUNITY): Payer: No Typology Code available for payment source | Admitting: Anesthesiology

## 2017-09-04 ENCOUNTER — Encounter (HOSPITAL_COMMUNITY): Payer: Self-pay | Admitting: *Deleted

## 2017-09-04 ENCOUNTER — Inpatient Hospital Stay (HOSPITAL_COMMUNITY): Payer: No Typology Code available for payment source

## 2017-09-04 ENCOUNTER — Inpatient Hospital Stay (HOSPITAL_COMMUNITY)
Admission: RE | Admit: 2017-09-04 | Discharge: 2017-09-05 | DRG: 470 | Disposition: A | Discharge status: Home / Self Care | Payer: No Typology Code available for payment source | Attending: Orthopedic Surgery | Admitting: Orthopedic Surgery

## 2017-09-04 ENCOUNTER — Other Ambulatory Visit: Payer: Self-pay

## 2017-09-04 ENCOUNTER — Encounter (HOSPITAL_COMMUNITY)
Admission: RE | Disposition: A | Discharge status: Home / Self Care | Payer: Self-pay | Source: Home / Self Care | Attending: Orthopedic Surgery

## 2017-09-04 DIAGNOSIS — Z79899 Other long term (current) drug therapy: Secondary | ICD-10-CM | POA: Diagnosis not present

## 2017-09-04 DIAGNOSIS — Z791 Long term (current) use of non-steroidal anti-inflammatories (NSAID): Secondary | ICD-10-CM

## 2017-09-04 DIAGNOSIS — Z96641 Presence of right artificial hip joint: Secondary | ICD-10-CM | POA: Diagnosis not present

## 2017-09-04 DIAGNOSIS — M87052 Idiopathic aseptic necrosis of left femur: Secondary | ICD-10-CM | POA: Diagnosis present

## 2017-09-04 DIAGNOSIS — Z539 Procedure and treatment not carried out, unspecified reason: Secondary | ICD-10-CM

## 2017-09-04 DIAGNOSIS — Z09 Encounter for follow-up examination after completed treatment for conditions other than malignant neoplasm: Secondary | ICD-10-CM

## 2017-09-04 DIAGNOSIS — M879 Osteonecrosis, unspecified: Secondary | ICD-10-CM | POA: Diagnosis not present

## 2017-09-04 DIAGNOSIS — F4310 Post-traumatic stress disorder, unspecified: Secondary | ICD-10-CM | POA: Diagnosis not present

## 2017-09-04 DIAGNOSIS — Z7982 Long term (current) use of aspirin: Secondary | ICD-10-CM

## 2017-09-04 DIAGNOSIS — M1612 Unilateral primary osteoarthritis, left hip: Principal | ICD-10-CM | POA: Diagnosis present

## 2017-09-04 HISTORY — PX: TOTAL HIP ARTHROPLASTY: SHX124

## 2017-09-04 HISTORY — DX: Post-traumatic stress disorder, unspecified: F43.10

## 2017-09-04 LAB — CBC
HCT: 43.6 % (ref 39.0–52.0)
Hemoglobin: 14.4 g/dL (ref 13.0–17.0)
MCH: 31.4 pg (ref 26.0–34.0)
MCHC: 33 g/dL (ref 30.0–36.0)
MCV: 95 fL (ref 78.0–100.0)
Platelets: 276 10*3/uL (ref 150–400)
RBC: 4.59 MIL/uL (ref 4.22–5.81)
RDW: 13 % (ref 11.5–15.5)
WBC: 5.7 10*3/uL (ref 4.0–10.5)

## 2017-09-04 LAB — BASIC METABOLIC PANEL
Anion gap: 9 (ref 5–15)
BUN: 9 mg/dL (ref 6–20)
CO2: 24 mmol/L (ref 22–32)
Calcium: 9.4 mg/dL (ref 8.9–10.3)
Chloride: 106 mmol/L (ref 98–111)
Creatinine, Ser: 0.92 mg/dL (ref 0.61–1.24)
GFR calc Af Amer: >60 mL/min (ref >60)
GFR calc non Af Amer: >60 mL/min (ref >60)
Glucose, Bld: 106 mg/dL — ABNORMAL HIGH (ref 70–99)
Potassium: 3.9 mmol/L (ref 3.5–5.1)
Sodium: 139 mmol/L (ref 135–145)

## 2017-09-04 LAB — TYPE AND SCREEN
ABO/RH(D): A POS
Antibody Screen: NEGATIVE

## 2017-09-04 LAB — ABO/RH: ABO/RH(D): A POS

## 2017-09-04 SURGERY — ARTHROPLASTY, HIP, TOTAL, ANTERIOR APPROACH
Anesthesia: Spinal | Site: Hip | Laterality: Left

## 2017-09-04 MED ORDER — CEFAZOLIN SODIUM-DEXTROSE 2-4 GM/100ML-% IV SOLN
2.0000 g | Freq: Four times a day (QID) | INTRAVENOUS | Status: AC
Start: 2017-09-04 — End: 2017-09-05
  Administered 2017-09-04 – 2017-09-05 (×2): 2 g via INTRAVENOUS
  Filled 2017-09-04 (×2): qty 100

## 2017-09-04 MED ORDER — ACETAMINOPHEN 325 MG PO TABS: 325.0000 mg | ORAL_TABLET | ORAL | Status: DC | PRN

## 2017-09-04 MED ORDER — SODIUM CHLORIDE 0.9 % IV SOLN
INTRAVENOUS | Status: DC
  Administered 2017-09-04: 18:00:00 via INTRAVENOUS

## 2017-09-04 MED ORDER — FENTANYL CITRATE (PF) 100 MCG/2ML IJ SOLN
INTRAMUSCULAR | Status: DC | PRN
  Administered 2017-09-04: 50 ug via INTRAVENOUS
  Administered 2017-09-04 (×2): 25 ug via INTRAVENOUS

## 2017-09-04 MED ORDER — METHOCARBAMOL 500 MG PO TABS
500.0000 mg | ORAL_TABLET | Freq: Four times a day (QID) | ORAL | Status: DC | PRN
  Administered 2017-09-05: 500 mg via ORAL
  Filled 2017-09-04: qty 1

## 2017-09-04 MED ORDER — CEFAZOLIN SODIUM-DEXTROSE 2-4 GM/100ML-% IV SOLN
2.0000 g | INTRAVENOUS | Status: AC
  Administered 2017-09-04: 2 g via INTRAVENOUS

## 2017-09-04 MED ORDER — PROPOFOL 500 MG/50ML IV EMUL
INTRAVENOUS | Status: DC | PRN
  Administered 2017-09-04: 15:00:00 via INTRAVENOUS
  Administered 2017-09-04: 75 ug/kg/min via INTRAVENOUS

## 2017-09-04 MED ORDER — POVIDONE-IODINE 10 % EX SWAB: 2.0000 "application " | Freq: Once | CUTANEOUS | Status: DC

## 2017-09-04 MED ORDER — ACETAMINOPHEN 160 MG/5ML PO SOLN: 325.0000 mg | ORAL | Status: DC | PRN

## 2017-09-04 MED ORDER — ONDANSETRON HCL 4 MG PO TABS: 4.0000 mg | ORAL_TABLET | Freq: Four times a day (QID) | ORAL | Status: DC | PRN

## 2017-09-04 MED ORDER — 0.9 % SODIUM CHLORIDE (POUR BTL) OPTIME
TOPICAL | Status: DC | PRN
  Administered 2017-09-04: 1000 mL

## 2017-09-04 MED ORDER — HYDROCODONE-ACETAMINOPHEN 5-325 MG PO TABS
1.0000 | ORAL_TABLET | ORAL | Status: DC | PRN
  Administered 2017-09-04 – 2017-09-05 (×4): 2 via ORAL
  Filled 2017-09-04 (×4): qty 2

## 2017-09-04 MED ORDER — DEXAMETHASONE SODIUM PHOSPHATE 10 MG/ML IJ SOLN
10.0000 mg | Freq: Once | INTRAMUSCULAR | Status: AC
  Administered 2017-09-05: 10 mg via INTRAVENOUS
  Filled 2017-09-04: qty 1

## 2017-09-04 MED ORDER — SODIUM CHLORIDE 0.9 % IJ SOLN
INTRAMUSCULAR | Status: DC | PRN
  Administered 2017-09-04: 30 mL

## 2017-09-04 MED ORDER — HYDROCODONE-ACETAMINOPHEN 7.5-325 MG PO TABS: 1.0000 | ORAL_TABLET | ORAL | Status: DC | PRN

## 2017-09-04 MED ORDER — CHLORHEXIDINE GLUCONATE 4 % EX LIQD: 60.0000 mL | Freq: Once | CUTANEOUS | Status: DC

## 2017-09-04 MED ORDER — ASPIRIN 81 MG PO CHEW
81.0000 mg | CHEWABLE_TABLET | Freq: Two times a day (BID) | ORAL | Status: DC
  Administered 2017-09-04 – 2017-09-05 (×2): 81 mg via ORAL
  Filled 2017-09-04 (×2): qty 1

## 2017-09-04 MED ORDER — METOCLOPRAMIDE HCL 5 MG PO TABS: 5.0000 mg | ORAL_TABLET | Freq: Three times a day (TID) | ORAL | Status: DC | PRN

## 2017-09-04 MED ORDER — MIDAZOLAM HCL 5 MG/5ML IJ SOLN
INTRAMUSCULAR | Status: DC | PRN
  Administered 2017-09-04: 2 mg via INTRAVENOUS

## 2017-09-04 MED ORDER — KETOROLAC TROMETHAMINE 30 MG/ML IJ SOLN
INTRAMUSCULAR | Status: AC
  Filled 2017-09-04: qty 1

## 2017-09-04 MED ORDER — ACETAMINOPHEN 10 MG/ML IV SOLN: 1000.0000 mg | INTRAVENOUS | Status: DC

## 2017-09-04 MED ORDER — ONDANSETRON HCL 4 MG/2ML IJ SOLN: 4.0000 mg | Freq: Four times a day (QID) | INTRAMUSCULAR | Status: DC | PRN

## 2017-09-04 MED ORDER — METHOCARBAMOL 1000 MG/10ML IJ SOLN
500.0000 mg | Freq: Four times a day (QID) | INTRAVENOUS | Status: DC | PRN
  Filled 2017-09-04: qty 5

## 2017-09-04 MED ORDER — SENNA 8.6 MG PO TABS
1.0000 | ORAL_TABLET | Freq: Two times a day (BID) | ORAL | Status: DC
  Administered 2017-09-04 – 2017-09-05 (×2): 8.6 mg via ORAL
  Filled 2017-09-04 (×2): qty 1

## 2017-09-04 MED ORDER — CEFAZOLIN SODIUM-DEXTROSE 2-4 GM/100ML-% IV SOLN
INTRAVENOUS | Status: AC
  Filled 2017-09-04: qty 100

## 2017-09-04 MED ORDER — KETOROLAC TROMETHAMINE 30 MG/ML IJ SOLN
INTRAMUSCULAR | Status: DC | PRN
  Administered 2017-09-04: 30 mg via INTRA_ARTICULAR

## 2017-09-04 MED ORDER — METOCLOPRAMIDE HCL 5 MG/ML IJ SOLN: 5.0000 mg | Freq: Three times a day (TID) | INTRAMUSCULAR | Status: DC | PRN

## 2017-09-04 MED ORDER — ONDANSETRON HCL 4 MG/2ML IJ SOLN
INTRAMUSCULAR | Status: AC
  Filled 2017-09-04: qty 2

## 2017-09-04 MED ORDER — SODIUM CHLORIDE 0.9 % IR SOLN
Status: DC | PRN
  Administered 2017-09-04: 3000 mL

## 2017-09-04 MED ORDER — DOCUSATE SODIUM 100 MG PO CAPS
100.0000 mg | ORAL_CAPSULE | Freq: Two times a day (BID) | ORAL | Status: DC
  Administered 2017-09-04 – 2017-09-05 (×2): 100 mg via ORAL
  Filled 2017-09-04 (×2): qty 1

## 2017-09-04 MED ORDER — ONDANSETRON HCL 4 MG/2ML IJ SOLN
INTRAMUSCULAR | Status: DC | PRN
  Administered 2017-09-04: 4 mg via INTRAVENOUS

## 2017-09-04 MED ORDER — BUPIVACAINE-EPINEPHRINE (PF) 0.5% -1:200000 IJ SOLN
INTRAMUSCULAR | Status: DC | PRN
  Administered 2017-09-04: 30 mL

## 2017-09-04 MED ORDER — OXYCODONE HCL 5 MG PO TABS: 5.0000 mg | ORAL_TABLET | Freq: Once | ORAL | Status: DC | PRN

## 2017-09-04 MED ORDER — MENTHOL 3 MG MT LOZG
1.0000 | LOZENGE | OROMUCOSAL | Status: DC | PRN
Start: 2017-09-04 — End: 2017-09-05

## 2017-09-04 MED ORDER — POLYETHYLENE GLYCOL 3350 17 G PO PACK: 17.0000 g | PACK | Freq: Every day | ORAL | Status: DC | PRN

## 2017-09-04 MED ORDER — SUMATRIPTAN SUCCINATE 100 MG PO TABS
100.0000 mg | ORAL_TABLET | ORAL | Status: DC | PRN
Start: 2017-09-04 — End: 2017-09-05

## 2017-09-04 MED ORDER — DIPHENHYDRAMINE HCL 12.5 MG/5ML PO ELIX: 12.5000 mg | ORAL_SOLUTION | ORAL | Status: DC | PRN

## 2017-09-04 MED ORDER — MEPERIDINE HCL 50 MG/ML IJ SOLN: 6.2500 mg | INTRAMUSCULAR | Status: DC | PRN

## 2017-09-04 MED ORDER — FENTANYL CITRATE (PF) 250 MCG/5ML IJ SOLN
INTRAMUSCULAR | Status: AC
  Filled 2017-09-04: qty 5

## 2017-09-04 MED ORDER — GABAPENTIN 300 MG PO CAPS
300.0000 mg | ORAL_CAPSULE | Freq: Every day | ORAL | Status: DC
  Administered 2017-09-04: 300 mg via ORAL
  Filled 2017-09-04: qty 1

## 2017-09-04 MED ORDER — PHENOL 1.4 % MT LIQD: 1.0000 | OROMUCOSAL | Status: DC | PRN

## 2017-09-04 MED ORDER — PROPOFOL 10 MG/ML IV BOLUS
INTRAVENOUS | Status: DC | PRN
  Administered 2017-09-04: 50 mg via INTRAVENOUS

## 2017-09-04 MED ORDER — MIDAZOLAM HCL 2 MG/2ML IJ SOLN
INTRAMUSCULAR | Status: AC
  Filled 2017-09-04: qty 2

## 2017-09-04 MED ORDER — CYCLOBENZAPRINE HCL 10 MG PO TABS
10.0000 mg | ORAL_TABLET | Freq: Every day | ORAL | Status: DC
  Administered 2017-09-04: 10 mg via ORAL
  Filled 2017-09-04: qty 1

## 2017-09-04 MED ORDER — FENTANYL CITRATE (PF) 100 MCG/2ML IJ SOLN: 25.0000 ug | INTRAMUSCULAR | Status: DC | PRN

## 2017-09-04 MED ORDER — LACTATED RINGERS IV SOLN
INTRAVENOUS | Status: DC
  Administered 2017-09-04 (×3): via INTRAVENOUS

## 2017-09-04 MED ORDER — DEXAMETHASONE SODIUM PHOSPHATE 10 MG/ML IJ SOLN
INTRAMUSCULAR | Status: DC | PRN
  Administered 2017-09-04: 10 mg via INTRAVENOUS

## 2017-09-04 MED ORDER — SODIUM CHLORIDE 0.9 % IV SOLN: INTRAVENOUS | Status: DC

## 2017-09-04 MED ORDER — ONDANSETRON HCL 4 MG/2ML IJ SOLN: 4.0000 mg | Freq: Once | INTRAMUSCULAR | Status: DC | PRN

## 2017-09-04 MED ORDER — BUPIVACAINE IN DEXTROSE 0.75-8.25 % IT SOLN
INTRATHECAL | Status: DC | PRN
  Administered 2017-09-04: 2 mL via INTRATHECAL

## 2017-09-04 MED ORDER — AMITRIPTYLINE HCL 10 MG PO TABS
10.0000 mg | ORAL_TABLET | Freq: Every day | ORAL | Status: DC
  Administered 2017-09-04: 10 mg via ORAL
  Filled 2017-09-04: qty 1

## 2017-09-04 MED ORDER — OXYCODONE HCL 5 MG/5ML PO SOLN: 5.0000 mg | Freq: Once | ORAL | Status: DC | PRN

## 2017-09-04 MED ORDER — ALUM & MAG HYDROXIDE-SIMETH 200-200-20 MG/5ML PO SUSP: 30.0000 mL | ORAL | Status: DC | PRN

## 2017-09-04 SURGICAL SUPPLY — 52 items
ALCOHOL ISOPROPYL (RUBBING) (MISCELLANEOUS) ×3 IMPLANT
BLADE CLIPPER SURG (BLADE) ×3 IMPLANT
CAPT HIP TOTAL 2 ×3 IMPLANT
CHLORAPREP W/TINT 26ML (MISCELLANEOUS) ×3 IMPLANT
COVER SURGICAL LIGHT HANDLE (MISCELLANEOUS) ×3 IMPLANT
DERMABOND ADVANCED (GAUZE/BANDAGES/DRESSINGS) ×4
DERMABOND ADVANCED .7 DNX12 (GAUZE/BANDAGES/DRESSINGS) ×2 IMPLANT
DRAPE C-ARM 42X72 X-RAY (DRAPES) ×3 IMPLANT
DRAPE STERI IOBAN 125X83 (DRAPES) ×3 IMPLANT
DRAPE U-SHAPE 47X51 STRL (DRAPES) ×6 IMPLANT
DRSG AQUACEL AG ADV 3.5X10 (GAUZE/BANDAGES/DRESSINGS) ×3 IMPLANT
ELECT BLADE 4.0 EZ CLEAN MEGAD (MISCELLANEOUS) ×3
ELECT PENCIL ROCKER SW 15FT (MISCELLANEOUS) ×3 IMPLANT
ELECT REM PT RETURN 9FT ADLT (ELECTROSURGICAL) ×3
ELECTRODE BLDE 4.0 EZ CLN MEGD (MISCELLANEOUS) ×1 IMPLANT
ELECTRODE REM PT RTRN 9FT ADLT (ELECTROSURGICAL) ×1 IMPLANT
EVACUATOR 1/8 PVC DRAIN (DRAIN) IMPLANT
GLOVE BIO SURGEON STRL SZ8.5 (GLOVE) ×6 IMPLANT
GLOVE BIOGEL PI IND STRL 8.5 (GLOVE) ×1 IMPLANT
GLOVE BIOGEL PI INDICATOR 8.5 (GLOVE) ×2
GOWN STRL REUS W/ TWL LRG LVL3 (GOWN DISPOSABLE) ×1 IMPLANT
GOWN STRL REUS W/TWL 2XL LVL3 (GOWN DISPOSABLE) ×6 IMPLANT
GOWN STRL REUS W/TWL LRG LVL3 (GOWN DISPOSABLE) ×2
HANDPIECE INTERPULSE COAX TIP (DISPOSABLE) ×2
HOOD PEEL AWAY FACE SHEILD DIS (HOOD) ×6 IMPLANT
KIT BASIN OR (CUSTOM PROCEDURE TRAY) ×3 IMPLANT
KIT TURNOVER KIT B (KITS) ×3 IMPLANT
MANIFOLD NEPTUNE II (INSTRUMENTS) ×3 IMPLANT
MARKER SKIN DUAL TIP RULER LAB (MISCELLANEOUS) ×6 IMPLANT
NEEDLE SPNL 18GX3.5 QUINCKE PK (NEEDLE) ×3 IMPLANT
NS IRRIG 1000ML POUR BTL (IV SOLUTION) ×3 IMPLANT
PACK TOTAL JOINT (CUSTOM PROCEDURE TRAY) ×3 IMPLANT
PACK UNIVERSAL I (CUSTOM PROCEDURE TRAY) ×3 IMPLANT
PAD ARMBOARD 7.5X6 YLW CONV (MISCELLANEOUS) ×3 IMPLANT
SAW OSC TIP CART 19.5X105X1.3 (SAW) ×3 IMPLANT
SEALER BIPOLAR AQUA 6.0 (INSTRUMENTS) ×3 IMPLANT
SET HNDPC FAN SPRY TIP SCT (DISPOSABLE) ×1 IMPLANT
SOL PREP POV-IOD 4OZ 10% (MISCELLANEOUS) IMPLANT
SUT ETHIBOND NAB CT1 #1 30IN (SUTURE) ×6 IMPLANT
SUT MNCRL AB 3-0 PS2 18 (SUTURE) ×3 IMPLANT
SUT MON AB 2-0 CT1 36 (SUTURE) ×3 IMPLANT
SUT VIC AB 1 CT1 27 (SUTURE) ×2
SUT VIC AB 1 CT1 27XBRD ANBCTR (SUTURE) ×1 IMPLANT
SUT VIC AB 2-0 CT1 27 (SUTURE) ×2
SUT VIC AB 2-0 CT1 TAPERPNT 27 (SUTURE) ×1 IMPLANT
SUT VLOC 180 0 24IN GS25 (SUTURE) ×3 IMPLANT
SYR 50ML LL SCALE MARK (SYRINGE) ×3 IMPLANT
TOWEL OR 17X24 6PK STRL BLUE (TOWEL DISPOSABLE) ×3 IMPLANT
TOWEL OR 17X26 10 PK STRL BLUE (TOWEL DISPOSABLE) ×3 IMPLANT
TRAY CATH 16FR W/PLASTIC CATH (SET/KITS/TRAYS/PACK) ×3 IMPLANT
TRAY FOLEY CATH SILVER 16FR (SET/KITS/TRAYS/PACK) IMPLANT
WATER STERILE IRR 1000ML POUR (IV SOLUTION) IMPLANT

## 2017-09-04 NOTE — Transfer of Care (Signed)
Immediate Anesthesia Transfer of Care Note  Patient: Dwayne Kramer  Procedure(s) Performed: LEFT TOTAL HIP ARTHROPLASTY ANTERIOR APPROACH (Left Hip)  Patient Location: PACU  Anesthesia Type:Spinal and MAC combined with regional for post-op pain  Level of Consciousness: awake and patient cooperative  Airway & Oxygen Therapy: Patient Spontanous Breathing  Post-op Assessment: Report given to RN and Post -op Vital signs reviewed and stable  Post vital signs: Reviewed and stable  Last Vitals:  Vitals Value Taken Time  BP 121/63 09/04/2017  3:50 PM  Temp 36.3 C 09/04/2017  3:49 PM  Pulse 74 09/04/2017  3:51 PM  Resp 16 09/04/2017  3:51 PM  SpO2 100 % 09/04/2017  3:51 PM  Vitals shown include unvalidated device data.  Last Pain:  Vitals:   09/04/17 1549  TempSrc:   PainSc: (P) Asleep         Complications: No apparent anesthesia complications

## 2017-09-04 NOTE — Discharge Instructions (Signed)
°Dr. Caylen Yardley °Joint Replacement Specialist °[facility] °[address] °[place], [place] [address] °[phone] ° ° °TOTAL HIP REPLACEMENT POSTOPERATIVE DIRECTIONS ° ° ° °Hip Rehabilitation, Guidelines Following Surgery  ° °WEIGHT BEARING °Weight bearing as tolerated with assist device (walker, cane, etc) as directed, use it as long as suggested by your surgeon or therapist, typically at least 4-6 weeks. ° °The results of a hip operation are greatly improved after range of motion and muscle strengthening exercises. Follow all safety measures which are given to protect your hip. If any of these exercises cause increased pain or swelling in your joint, decrease the amount until you are comfortable again. Then slowly increase the exercises. Call your caregiver if you have problems or questions.  ° °HOME CARE INSTRUCTIONS  °Most of the following instructions are designed to prevent the dislocation of your new hip.  °Remove items at home which could result in a fall. This includes throw rugs or furniture in walking pathways.  °Continue medications as instructed at time of discharge. °· You may have some home medications which will be placed on hold until you complete the course of blood thinner medication. °· You may start showering once you are discharged home. Do not remove your dressing. °Do not put on socks or shoes without following the instructions of your caregivers.   °Sit on chairs with arms. Use the chair arms to help push yourself up when arising.  °Arrange for the use of a toilet seat elevator so you are not sitting low.  °· Walk with walker as instructed.  °You may resume a sexual relationship in one month or when given the OK by your caregiver.  °Use walker as long as suggested by your caregivers.  °You may put full weight on your legs and walk as much as is comfortable. °Avoid periods of inactivity such as sitting longer than an hour when not asleep. This helps prevent  blood clots.  °You may return to work once you are cleared by your surgeon.  °Do not drive a car for 6 weeks or until released by your surgeon.  °Do not drive while taking narcotics.  °Wear elastic stockings for two weeks following surgery during the day but you may remove then at night.  °Make sure you keep all of your appointments after your operation with all of your doctors and caregivers. You should call the office at the above phone number and make an appointment for approximately two weeks after the date of your surgery. °Please pick up a stool softener and laxative for home use as long as you are requiring pain medications. °· ICE to the affected hip every three hours for 30 minutes at a time and then as needed for pain and swelling. Continue to use ice on the hip for pain and swelling from surgery. You may notice swelling that will progress down to the foot and ankle.  This is normal after surgery.  Elevate the leg when you are not up walking on it.   °It is important for you to complete the blood thinner medication as prescribed by your doctor. °· Continue to use the breathing machine which will help keep your temperature down.  It is common for your temperature to cycle up and down following surgery, especially at night when you are not up moving around and exerting yourself.  The breathing machine keeps your lungs expanded and your temperature down. ° °RANGE OF MOTION AND STRENGTHENING EXERCISES  °These exercises are   designed to help you keep full movement of your hip joint. Follow your caregiver's or physical therapist's instructions. Perform all exercises about fifteen times, three times per day or as directed. Exercise both hips, even if you have had only one joint replacement. These exercises can be done on a training (exercise) mat, on the floor, on a table or on a bed. Use whatever works the best and is most comfortable for you. Use music or television while you are exercising so that the exercises  are a pleasant break in your day. This will make your life better with the exercises acting as a break in routine you can look forward to.  °Lying on your back, slowly slide your foot toward your buttocks, raising your knee up off the floor. Then slowly slide your foot back down until your leg is straight again.  °Lying on your back spread your legs as far apart as you can without causing discomfort.  °Lying on your side, raise your upper leg and foot straight up from the floor as far as is comfortable. Slowly lower the leg and repeat.  °Lying on your back, tighten up the muscle in the front of your thigh (quadriceps muscles). You can do this by keeping your leg straight and trying to raise your heel off the floor. This helps strengthen the largest muscle supporting your knee.  °Lying on your back, tighten up the muscles of your buttocks both with the legs straight and with the knee bent at a comfortable angle while keeping your heel on the floor.  ° °SKILLED REHAB INSTRUCTIONS: °If the patient is transferred to a skilled rehab facility following release from the hospital, a list of the current medications will be sent to the facility for the patient to continue.  When discharged from the skilled rehab facility, please have the facility set up the patient's Home Health Physical Therapy prior to being released. Also, the skilled facility will be responsible for providing the patient with their medications at time of release from the facility to include their pain medication and their blood thinner medication. If the patient is still at the rehab facility at time of the two week follow up appointment, the skilled rehab facility will also need to assist the patient in arranging follow up appointment in our office and any transportation needs. ° °MAKE SURE YOU:  °Understand these instructions.  °Will watch your condition.  °Will get help right away if you are not doing well or get worse. ° °Pick up stool softner and  laxative for home use following surgery while on pain medications. °Do not remove your dressing. °The dressing is waterproof--it is OK to take showers. °Continue to use ice for pain and swelling after surgery. °Do not use any lotions or creams on the incision until instructed by your surgeon. °Total Hip Protocol. ° ° °

## 2017-09-04 NOTE — Anesthesia Postprocedure Evaluation (Signed)
Anesthesia Post Note  Patient: Dwayne Kramer  Procedure(s) Performed: LEFT TOTAL HIP ARTHROPLASTY ANTERIOR APPROACH (Left Hip)     Patient location during evaluation: PACU Anesthesia Type: Spinal Level of consciousness: oriented and awake and alert Pain management: pain level controlled Vital Signs Assessment: post-procedure vital signs reviewed and stable Respiratory status: spontaneous breathing, respiratory function stable and patient connected to nasal cannula oxygen Cardiovascular status: blood pressure returned to baseline and stable Postop Assessment: no headache, no backache and no apparent nausea or vomiting Anesthetic complications: no    Last Vitals:  Vitals:   09/04/17 1605 09/04/17 1620  BP: 123/71 (!) 102/92  Pulse: 79 70  Resp: 14 18  Temp:    SpO2: 100% 100%    Last Pain:  Vitals:   09/04/17 1549  TempSrc:   PainSc: Asleep                 [name]

## 2017-09-04 NOTE — Progress Notes (Signed)
1715 Received pt from PACU, A&O x4. Left hip incision with Aquacell dressing dry and intact. Denies pain at this time.

## 2017-09-04 NOTE — Anesthesia Procedure Notes (Signed)
Procedure Name: MAC Date/Time: 09/04/2017 1:45 PM Performed by: Lance Coon, CRNA Pre-anesthesia Checklist: Patient identified, Emergency Drugs available, Patient being monitored, Timeout performed and Suction available Patient Re-evaluated:Patient Re-evaluated prior to induction Oxygen Delivery Method: Simple face mask

## 2017-09-04 NOTE — Anesthesia Procedure Notes (Signed)
Spinal  Patient location during procedure: OR Start time: 09/04/2017 1:49 PM End time: 09/04/2017 1:51 PM Staffing Anesthesiologist: Bethena Midgetddono, [place] Franson, MD Preanesthetic Checklist Completed: patient identified, site marked, surgical consent, pre-op evaluation, timeout performed, IV checked, risks and benefits discussed and monitors and equipment checked Spinal Block Patient position: sitting Prep: DuraPrep Patient monitoring: heart rate, cardiac monitor, continuous pulse ox and blood pressure Approach: midline Location: L3-4 Injection technique: single-shot Needle Needle type: Sprotte  Needle gauge: 24 G Needle length: 9 cm Assessment Sensory level: T4

## 2017-09-04 NOTE — Op Note (Signed)
OPERATIVE REPORT  SURGEON: Samson FredericBrian Gayle Martinez, MD   ASSISTANT: Hart CarwinJustin Queen, RNFA.  PREOPERATIVE DIAGNOSIS: Left hip avascular necrosis.   POSTOPERATIVE DIAGNOSIS: Left hip avascular necrosis.   PROCEDURE: Left total hip arthroplasty, anterior approach.   IMPLANTS: DePuy Tri Lock stem, size 4, hi offset. DePuy Pinnacle Cup, size 52 mm. DePuy Altrx liner, size 32 by 52 mm, neutral. DePuy Biolox ceramic head ball, size 32 + 5 mm.  ANESTHESIA:  Spinal  ESTIMATED BLOOD LOSS:-400 mL    ANTIBIOTICS: 2 g Ancef.  DRAINS: None.  COMPLICATIONS: None.   CONDITION: PACU - hemodynamically stable.   BRIEF CLINICAL NOTE: Dwayne Kramer is a 27 y.o. male with a long-standing history of Left hip avascular necrosis. After failing conservative management, the patient was indicated for total hip arthroplasty. The risks, benefits, and alternatives to the procedure were explained, and the patient elected to proceed.  PROCEDURE IN DETAIL: Surgical site was marked by myself in the pre-op holding area. Once inside the operating room, spinal anesthesia was obtained, and a foley catheter was inserted. The patient was then positioned on the Hana table. All bony prominences were well padded. The hip was prepped and draped in the normal sterile surgical fashion. A time-out was called verifying side and site of surgery. The patient received IV antibiotics within 60 minutes of beginning the procedure.  The direct anterior approach to the hip was performed through the Hueter interval. Lateral femoral circumflex vessels were treated with the Auqumantys. The anterior capsule was exposed and an inverted T capsulotomy was made.The femoral neck cut was made to the level of the templated cut. A corkscrew was placed into the head and the head was removed. The femoral head was found to have eburnated bone. The head was passed to the back table and was measured.  Acetabular exposure was achieved, and the  pulvinar and labrum were excised. Sequential reaming of the acetabulum was then performed up to a size 51 mm reamer. A 52 mm cup was then opened and impacted into place at approximately 40 degrees of abduction and 20 degrees of anteversion. The final polyethylene liner was impacted into place and acetabular osteophytes were removed.   I then gained femoral exposure taking care to protect the abductors and greater trochanter. This was performed using standard external rotation, extension, and adduction. The capsule was peeled off the inner aspect of the greater trochanter, taking care to preserve the short external rotators. A cookie cutter was used to enter the femoral canal, and then the femoral canal finder was placed. Sequential broaching was performed up to a size 4. Calcar planer was used on the femoral neck remnant. I placed a hi offset neck and a trial head ball. The hip was reduced. Leg lengths and offset were checked fluoroscopically. The hip was dislocated and trial components were removed. The final implants were placed, and the hip was reduced.  Fluoroscopy was used to confirm component position and leg lengths. At 90 degrees of external rotation and full extension, the hip was stable to an anterior directed force.  The wound was copiously irrigated with normal saline using pulse lavage. Marcaine solution was injected into the periarticular soft tissue. The wound was closed in layers using #1 Vicryl and V-Loc for the fascia, 2-0 Vicryl for the subcutaneous fat, 2-0 Monocryl for the deep dermal layer, 3-0 running Monocryl subcuticular stitch, and Dermabond for the skin. Once the glue was fully dried, an Aquacell Ag dressing was applied. The patient was transported to  the recovery room in stable condition. Sponge, needle, and instrument counts were correct at the end of the case x2. The patient tolerated the procedure well and there were no known complications.

## 2017-09-04 NOTE — Interval H&P Note (Signed)
History and Physical Interval Note:  09/04/2017 12:43 PM  Dwayne Kramer  has presented today for surgery, with the diagnosis of Avascular necrosis left hip  The various methods of treatment have been discussed with the patient and family. After consideration of risks, benefits and other options for treatment, the patient has consented to  Procedure(s) with comments: LEFT TOTAL HIP ARTHROPLASTY ANTERIOR APPROACH (Left) - Needs RNFA as a surgical intervention .  The patient's history has been reviewed, patient examined, no change in status, stable for surgery.  I have reviewed the patient's chart and labs.  Questions were answered to the patient's satisfaction.     Iline OvenBrian J Yishai Rehfeld

## 2017-09-05 ENCOUNTER — Encounter (HOSPITAL_COMMUNITY): Payer: Self-pay | Admitting: Orthopedic Surgery

## 2017-09-05 LAB — BASIC METABOLIC PANEL
ANION GAP: 8 (ref 5–15)
BUN: 9 mg/dL (ref 6–20)
CHLORIDE: 104 mmol/L (ref 98–111)
CO2: 25 mmol/L (ref 22–32)
Calcium: 9.1 mg/dL (ref 8.9–10.3)
Creatinine, Ser: 0.91 mg/dL (ref 0.61–1.24)
GFR calc Af Amer: >60 mL/min (ref >60)
GFR calc non Af Amer: >60 mL/min (ref >60)
GLUCOSE: 138 mg/dL — AB (ref 70–99)
POTASSIUM: 3.9 mmol/L (ref 3.5–5.1)
Sodium: 137 mmol/L (ref 135–145)

## 2017-09-05 LAB — CBC
HEMATOCRIT: 39.4 % (ref 39.0–52.0)
HEMOGLOBIN: 13.3 g/dL (ref 13.0–17.0)
MCH: 31.5 pg (ref 26.0–34.0)
MCHC: 33.8 g/dL (ref 30.0–36.0)
MCV: 93.4 fL (ref 78.0–100.0)
Platelets: 267 10*3/uL (ref 150–400)
RBC: 4.22 MIL/uL (ref 4.22–5.81)
RDW: 12.7 % (ref 11.5–15.5)
WBC: 15.1 10*3/uL — AB (ref 4.0–10.5)

## 2017-09-05 NOTE — Progress Notes (Signed)
Physical Therapy Treatment Patient Details Name: Dwayne Kramer MRN: [id number] DOB: 1990-06-15 Today's Date: 09/05/2017    History of Present Illness Pt is 27 yo who presents for L THA due to avascular necrosis. Had R THA in 3/19.     PT Comments    Pt with 3/10 L hip pain after ambulating 500' with RW. Safe with mobility. Feels ready to d/c home.    Follow Up Recommendations  Follow surgeon's recommendation for DC plan and follow-up therapies. Would benefit from outpatient when appropriate.      Equipment Recommendations  None recommended by PT    Recommendations for Other Services       Precautions / Restrictions Precautions Precautions: None Restrictions Weight Bearing Restrictions: Yes LLE Weight Bearing: Weight bearing as tolerated    Mobility  Bed Mobility Overal bed mobility: Modified Independent Bed Mobility: Supine to Sit;Sit to Supine     Supine to sit: Modified independent (Device/Increase time) Sit to supine: Modified independent (Device/Increase time)   General bed mobility comments: pt able to get in and out of bed without assist  Transfers Overall transfer level: Modified independent Equipment used: Rolling walker (2 wheeled) Transfers: Sit to/from Stand Sit to Stand: Modified independent (Device/Increase time)         General transfer comment: pt stood safely to RW several times  Ambulation/Gait Ambulation/Gait assistance: Supervision Gait Distance (Feet): 500 Feet Assistive device: Rolling walker (2 wheeled) Gait Pattern/deviations: Step-through pattern;Antalgic Gait velocity: decreased Gait velocity interpretation: 1.31 - 2.62 ft/sec, indicative of limited community ambulator General Gait Details: vc's for symmetrical gait pattern   Stairs Stairs: Yes Stairs assistance: Min guard Stair Management: One rail Right;Step to pattern;Forwards Number of Stairs: 6 General stair comments: pt able to ascend on R and LLE, descends with LLE  each time, advised to continue with this for safety first week   Wheelchair Mobility    Modified Rankin (Stroke Patients Only)       Balance Overall balance assessment: No apparent balance deficits (not formally assessed)                                          Cognition Arousal/Alertness: Awake/alert Behavior During Therapy: WFL for tasks assessed/performed Overall Cognitive Status: Within Functional Limits for tasks assessed                                        Exercises Total Joint Exercises Ankle Circles/Pumps: AROM;Both;10 reps;Seated Quad Sets: AROM;Both;10 reps;Seated Gluteal Sets: AROM;Both;10 reps;Seated Heel Slides: AROM;10 reps;Supine Hip ABduction/ADduction: Left;10 reps;Standing Straight Leg Raises: AROM;Supine;10 reps;Left Long Arc Quad: AROM;Left;10 reps;Seated Standing Hip Extension: AROM;Left;10 reps    General Comments General comments (skin integrity, edema, etc.): discussed car transfer and activity level at home as well as footwear      Pertinent Vitals/Pain Pain Assessment: 0-10 Pain Score: 3  Pain Location: L hip Pain Descriptors / Indicators: Aching Pain Intervention(s): Limited activity within patient's tolerance;Monitored during session    Home Living Family/patient expects to be discharged to:: Private residence Living Arrangements: Spouse/significant other Available Help at Discharge: Family;Available PRN/intermittently Type of Home: House Home Access: Stairs to enter Entrance Stairs-Rails: None Home Layout: One level Home Equipment: Walker - 2 wheels      Prior Function Level of Independence: Independent  Comments: pt has retuned to independence since first THA in 3/19 though he reports that he fell twice after surgery (not on R hip)   PT Goals (current goals can now be found in the care plan section) Acute Rehab PT Goals Patient Stated Goal: return home, work again PT Goal Formulation:  With patient Time For Goal Achievement: 09/19/17 Potential to Achieve Goals: Good Progress towards PT goals: Progressing toward goals    Frequency    7X/week      PT Plan Current plan remains appropriate    Co-evaluation              AM-PAC PT "6 Clicks" Daily Activity  Outcome Measure  Difficulty turning over in bed (including adjusting bedclothes, sheets and blankets)?: None Difficulty moving from lying on back to sitting on the side of the bed? : None Difficulty sitting down on and standing up from a chair with arms (e.g., wheelchair, bedside commode, etc,.)?: None Help needed moving to and from a bed to chair (including a wheelchair)?: None Help needed walking in hospital room?: None Help needed climbing 3-5 steps with a railing? : A Little 6 Click Score: 23    End of Session   Activity Tolerance: Patient tolerated treatment well Patient left: in bed;with call bell/phone within reach;with family/visitor present Nurse Communication: Mobility status PT Visit Diagnosis: Pain;Other abnormalities of gait and mobility (R26.89) Pain - Right/Left: Left Pain - part of body: Hip     Time: [id number] PT Time Calculation (min) (ACUTE ONLY): 23 min  Charges:  $Gait Training: 8-22 mins $Therapeutic Exercise: 8-22 mins                    G Codes:       Lyanne CoVictoria Levern Pitter, PT  Acute Rehab Services  [phone]    [place]keyVictoria L Ambert Virrueta 09/05/2017, 1:32 PM

## 2017-09-05 NOTE — Discharge Summary (Signed)
Physician Discharge Summary  Patient ID: Dwayne Kramer MRN: [id number] DOB/AGE: 1990-11-29 27 y.o.  Admit date: 09/04/2017 Discharge date: 09/05/2017  Admission Diagnoses:  Avascular necrosis of hip, left [facility](HCC)  Discharge Diagnoses:  Principal Problem:   Avascular necrosis of hip, left [facility](HCC)   Past Medical History:  Diagnosis Date  . Arthritis   . Heart murmur    as infant  resolved  . PTSD (post-traumatic stress disorder) 2017    Surgeries: Procedure(s): LEFT TOTAL HIP ARTHROPLASTY ANTERIOR APPROACH on 09/04/2017   Consultants (if any):   Discharged Condition: Improved  Hospital Course: Dwayne Kramer is an 27 y.o. male who was admitted 09/04/2017 with a diagnosis of Avascular necrosis of hip, left (HCC) and went to the operating room on 09/04/2017 and underwent the above named procedures.    He was given perioperative antibiotics:  Anti-infectives (From admission, onward)   Start     Dose/Rate Route Frequency Ordered Stop   09/04/17 2000  ceFAZolin (ANCEF) IVPB 2g/100 mL premix     2 g 200 mL/hr over 30 Minutes Intravenous Every 6 hours 09/04/17 1713 09/05/17 0247   09/04/17 1200  ceFAZolin (ANCEF) IVPB 2g/100 mL premix     2 g 200 mL/hr over 30 Minutes Intravenous On call to O.R. 09/04/17 1048 09/04/17 1359   09/04/17 1055  ceFAZolin (ANCEF) 2-4 GM/100ML-% IVPB    Note to Pharmacy:  Kathrene BongoSmith, Catherine   : cabinet override      09/04/17 1055 09/04/17 1359   08/21/17 1000  ceFAZolin (ANCEF) IVPB 2g/100 mL premix     2 g 200 mL/hr over 30 Minutes Intravenous To [facility] 08/17/17 1056 08/22/17 1000    .  He was given sequential compression devices, early ambulation, and ASA for DVT prophylaxis.  He benefited maximally from the hospital stay and there were no complications.    Recent vital signs:  Vitals:   09/05/17 0015 09/05/17 0519  BP: 124/74 119/67  Pulse: 88 87  Resp: 16 16  Temp: 98.5 F (36.9 C) 98.2 F (36.8 C)  SpO2: 98% 98%    Recent  laboratory studies:  Lab Results  Component Value Date   HGB 13.3 09/05/2017   HGB 14.4 09/04/2017   HGB 15.1 08/15/2017   Lab Results  Component Value Date   WBC 15.1 (H) 09/05/2017   PLT 267 09/05/2017   No results found for: INR Lab Results  Component Value Date   NA 137 09/05/2017   K 3.9 09/05/2017   CL 104 09/05/2017   CO2 25 09/05/2017   BUN 9 09/05/2017   CREATININE 0.91 09/05/2017   GLUCOSE 138 (H) 09/05/2017    Discharge Medications:   Allergies as of 09/05/2017   No Known Allergies     Medication List    STOP taking these medications   diclofenac sodium 1 % Gel Commonly known as:  VOLTAREN   docusate sodium 100 MG capsule Commonly known as:  COLACE   ondansetron 4 MG tablet Commonly known as:  ZOFRAN   promethazine 25 MG tablet Commonly known as:  PHENERGAN   senna 8.6 MG Tabs tablet Commonly known as:  SENOKOT     TAKE these medications   amitriptyline 10 MG tablet Commonly known as:  ELAVIL Take 10 mg by mouth at bedtime.   aspirin 81 MG chewable tablet Chew 1 tablet (81 mg total) by mouth 2 (two) times daily.   cyclobenzaprine 10 MG tablet Commonly known as:  FLEXERIL Take 10 mg  by mouth at bedtime.   gabapentin 300 MG capsule Commonly known as:  NEURONTIN Take 300 mg by mouth at bedtime.   HYDROcodone-acetaminophen 5-325 MG tablet Commonly known as:  NORCO/VICODIN Take 1-2 tablets by mouth every 4 (four) hours as needed.   naproxen 500 MG tablet Commonly known as:  NAPROSYN Take 500 mg by mouth 2 (two) times daily with a meal.   SUMAtriptan 100 MG tablet Commonly known as:  IMITREX Take 100 mg by mouth every 2 (two) hours as needed for migraine. May repeat in 2 hours if headache persists or recurs.       Diagnostic Studies: Dg Pelvis Portable  Result Date: 09/04/2017 CLINICAL DATA:  Status post left hip joint prosthesis placement. EXAM: PORTABLE PELVIS 1-2 VIEWS COMPARISON:  Fluoro spot radiographs of today's date  FINDINGS: The patient has undergone placement of a left hip joint prosthesis. Radiographic positioning of the prosthetic components is good. The interface with the native bone appears normal. There is a pre-existing right hip joint prosthesis. IMPRESSION: There is no immediate postprocedure complication following left hip joint prosthesis placement. Electronically Signed   By: David  [place] M.D.   On: 09/04/2017 16:18   Dg C-arm 1-60 Min  Result Date: 09/04/2017 CLINICAL DATA:  Status post left total hip arthroplasty. EXAM: OPERATIVE left HIP (WITH PELVIS IF PERFORMED) 3 VIEWS TECHNIQUE: Fluoroscopic spot image(s) were submitted for interpretation post-operatively. FLUOROSCOPY TIME:  23 seconds. COMPARISON:  None. FINDINGS: Three intraoperative fluoroscopic images demonstrate placement of left total hip arthroplasty. The femoral and acetabular components appear to be well situated. IMPRESSION: Status post left total hip arthroplasty. Electronically Signed   By: Lupita Raider, M.D.   On: 09/04/2017 15:21   Dg Hip Operative Unilat W Or W/o Pelvis Left  Result Date: 09/04/2017 CLINICAL DATA:  Status post left total hip arthroplasty. EXAM: OPERATIVE left HIP (WITH PELVIS IF PERFORMED) 3 VIEWS TECHNIQUE: Fluoroscopic spot image(s) were submitted for interpretation post-operatively. FLUOROSCOPY TIME:  23 seconds. COMPARISON:  None. FINDINGS: Three intraoperative fluoroscopic images demonstrate placement of left total hip arthroplasty. The femoral and acetabular components appear to be well situated. IMPRESSION: Status post left total hip arthroplasty. Electronically Signed   By: Lupita Raider, M.D.   On: 09/04/2017 15:21    Disposition:    Discharge Instructions    Call MD / Call 911   Complete by:  As directed    If you experience chest pain or shortness of breath, CALL 911 and be transported to the hospital emergency room.  If you develope a fever above 101 F, pus (white drainage) or increased  drainage or redness at the wound, or calf pain, call your surgeon's office.   Constipation Prevention   Complete by:  As directed    Drink plenty of fluids.  Prune juice may be helpful.  You may use a stool softener, such as Colace (over the counter) 100 mg twice a day.  Use MiraLax (over the counter) for constipation as needed.   Diet - low sodium heart healthy   Complete by:  As directed    Driving restrictions   Complete by:  As directed    No driving for 2 weeks   Increase activity slowly as tolerated   Complete by:  As directed    Lifting restrictions   Complete by:  As directed    No lifting for 6 weeks   TED hose   Complete by:  As directed    Use stockings (TED  hose) for 2 weeks on both leg(s).  You may remove them at night for sleeping.      Follow-up Information    Breanna Shorkey, Arlys John, MD. Schedule an appointment as soon as possible for a visit in 2 weeks.   Specialty:  Orthopedic Surgery Why:  For wound re-check Contact information: [address] [place] [address] [phone]            Signed: Iline Oven Lenwood Balsam 09/07/2017, 4:45 PM

## 2017-09-05 NOTE — Progress Notes (Signed)
Pt is ambulating to the hall. Pain is controlled. Left hip dressing dry and intact. Discharge instructions given to pt, discharged home accompanied by wife.

## 2017-09-05 NOTE — Progress Notes (Addendum)
    Subjective:  Patient reports pain as mild to moderate.  Denies N/V/CP/SOB. No c/o.  Objective:   VITALS:   Vitals:   09/04/17 1715 09/04/17 1936 09/05/17 0015 09/05/17 0519  BP: 126/89 140/82 124/74 119/67  Pulse: 76 95 88 87  Resp:  16 16 16   Temp:  98.1 F (36.7 C) 98.5 F (36.9 C) 98.2 F (36.8 C)  TempSrc:  Oral Oral Oral  SpO2: 100% 98% 98% 98%  Weight: 91 kg (200 lb 9.9 oz)     Height: 5\' 7"  (1.702 m)       NAD ABD soft Sensation intact distally Intact pulses distally Dorsiflexion/Plantar flexion intact Incision: dressing C/D/I Compartment soft   Lab Results  Component Value Date   WBC 15.1 (H) 09/05/2017   HGB 13.3 09/05/2017   HCT 39.4 09/05/2017   MCV 93.4 09/05/2017   PLT 267 09/05/2017   BMET    Component Value Date/Time   NA 137 09/05/2017 0406   K 3.9 09/05/2017 0406   CL 104 09/05/2017 0406   CO2 25 09/05/2017 0406   GLUCOSE 138 (H) 09/05/2017 0406   BUN 9 09/05/2017 0406   CREATININE 0.91 09/05/2017 0406   CALCIUM 9.1 09/05/2017 0406   GFRNONAA >60 09/05/2017 0406   GFRAA >60 09/05/2017 0406     Assessment/Plan: 1 Day Post-Op   Principal Problem:   Avascular necrosis of hip, left (HCC)   WBAT with walker DVT ppx: Aspirin, SCDs, TEDS PO pain control PT/OT Dispo: D/C home with HEP, already has Rx at home    Jonette PesaBrian J Zakhia Seres 09/05/2017, 7:49 AM   Samson FredericBrian Marlowe Lawes, MD Cell [phone]

## 2017-09-05 NOTE — Evaluation (Signed)
Physical Therapy Evaluation Patient Details Name: Dwayne GeraldWilliam J Galla MRN: [id number] DOB: 09/04/1990 Today's Date: 09/05/2017   History of Present Illness  Pt is 27 yo who presents for L THA due to avascular necrosis. Had R THA in 3/19.   Clinical Impression  Pt is s/p THA resulting in the deficits listed below (see PT Problem List). Pt mobilizing very well and familiar with what he needs to do since last surgery was just 4 months ago. Pt does mention that he fell twice after first surgery (once washing car and once stepping off a step), discussed safety and increasing activity level slowly.  Pt will benefit from skilled PT to increase their independence and safety with mobility to allow discharge to the venue listed below.      Follow Up Recommendations Follow surgeon's recommendation for DC plan and follow-up therapies  Note: Last surgery pt had HH. Since he has now had both hips done, would benefit from OP PT to return to full level strength to return to work.       Equipment Recommendations  None recommended by PT    Recommendations for Other Services     Precautions / Restrictions Precautions Precautions: None Restrictions Weight Bearing Restrictions: Yes LLE Weight Bearing: Weight bearing as tolerated      Mobility  Bed Mobility Overal bed mobility: Needs Assistance Bed Mobility: Supine to Sit;Sit to Supine     Supine to sit: Supervision Sit to supine: Min assist   General bed mobility comments: min A to LE's for return to bed. Able to get out of bed independently  Transfers Overall transfer level: Needs assistance Equipment used: Rolling walker (2 wheeled) Transfers: Sit to/from Stand Sit to Stand: Supervision         General transfer comment: supervision for safety  Ambulation/Gait Ambulation/Gait assistance: Supervision Gait Distance (Feet): 200 Feet Assistive device: Rolling walker (2 wheeled) Gait Pattern/deviations: Step-through pattern;Antalgic Gait  velocity: decreased Gait velocity interpretation: 1.31 - 2.62 ft/sec, indicative of limited community ambulator General Gait Details: vc's for symmetrical gait pattern  Stairs Stairs: Yes Stairs assistance: Min guard Stair Management: One rail Right;Step to pattern;Forwards Number of Stairs: 6 General stair comments: pt able to ascend on R and LLE, descends with LLE each time, advised to continue with this for safety first week  Wheelchair Mobility    Modified Rankin (Stroke Patients Only)       Balance Overall balance assessment: No apparent balance deficits (not formally assessed)                                           Pertinent Vitals/Pain Pain Assessment: 0-10 Pain Score: 2  Pain Location: L hip Pain Descriptors / Indicators: Aching;Operative site guarding Pain Intervention(s): Limited activity within patient's tolerance;Monitored during session    Home Living Family/patient expects to be discharged to:: Private residence Living Arrangements: Spouse/significant other Available Help at Discharge: Family;Available PRN/intermittently Type of Home: House Home Access: Stairs to enter Entrance Stairs-Rails: None Entrance Stairs-Number of Steps: 1 Home Layout: One level Home Equipment: Walker - 2 wheels      Prior Function Level of Independence: Independent         Comments: pt has retuned to independence since first THA in 3/19 though he reports that he fell twice after surgery (not on R hip)     Hand Dominance  Extremity/Trunk Assessment   Upper Extremity Assessment Upper Extremity Assessment: Overall WFL for tasks assessed    Lower Extremity Assessment Lower Extremity Assessment: RLE deficits/detail;LLE deficits/detail RLE Deficits / Details: hip flex 4/5, hip add/ abd 4/5, knee and ankle WFL RLE Sensation: WNL RLE Coordination: WNL LLE Deficits / Details: hip flex 3-/5, knee ext 4/5, knee flex 4/5 LLE Sensation: WNL LLE  Coordination: WNL    Cervical / Trunk Assessment Cervical / Trunk Assessment: Normal  Communication   Communication: No difficulties  Cognition Arousal/Alertness: Awake/alert Behavior During Therapy: WFL for tasks assessed/performed Overall Cognitive Status: Within Functional Limits for tasks assessed                                        General Comments      Exercises Total Joint Exercises Ankle Circles/Pumps: AROM;Both;10 reps;Supine Quad Sets: AROM;Both;10 reps;Supine Heel Slides: AROM;Both;10 reps;Supine Straight Leg Raises: AROM;Both;5 reps;Supine Long Arc Quad: AROM;Left;10 reps;Seated   Assessment/Plan    PT Assessment Patient needs continued PT services  PT Problem List Decreased strength;Decreased range of motion;Decreased mobility;Pain       PT Treatment Interventions DME instruction;Gait training;Stair training;Functional mobility training;Therapeutic activities;Therapeutic exercise;Patient/family education    PT Goals (Current goals can be found in the Care Plan section)  Acute Rehab PT Goals Patient Stated Goal: return home, work again PT Goal Formulation: With patient Time For Goal Achievement: 09/19/17 Potential to Achieve Goals: Good    Frequency 7X/week   Barriers to discharge        Co-evaluation               AM-PAC PT "6 Clicks" Daily Activity  Outcome Measure Difficulty turning over in bed (including adjusting bedclothes, sheets and blankets)?: A Little Difficulty moving from lying on back to sitting on the side of the bed? : A Little Difficulty sitting down on and standing up from a chair with arms (e.g., wheelchair, bedside commode, etc,.)?: A Little Help needed moving to and from a bed to chair (including a wheelchair)?: A Little Help needed walking in hospital room?: A Little Help needed climbing 3-5 steps with a railing? : A Little 6 Click Score: 18    End of Session   Activity Tolerance: Patient tolerated  treatment well Patient left: in bed;with call bell/phone within reach;with family/visitor present Nurse Communication: Mobility status PT Visit Diagnosis: Pain;Other abnormalities of gait and mobility (R26.89) Pain - Right/Left: Left Pain - part of body: Hip    Time: 1610-9604 PT Time Calculation (min) (ACUTE ONLY): 26 min   Charges:   PT Evaluation $PT Eval Low Complexity: 1 Low PT Treatments $Gait Training: 8-22 mins   PT G Codes:        Lyanne Co, PT  Acute Rehab Services  [phone]   [place] Coni Homesley 09/05/2017, 9:58 AM

## 2019-05-09 IMAGING — DX DG PORTABLE PELVIS
1 series · 1 of 1 positions shown · non-contrast
Comparison: None.

CLINICAL DATA: Postop hip replacement

EXAM:
PORTABLE PELVIS 1-2 VIEWS

[pelvis ap]
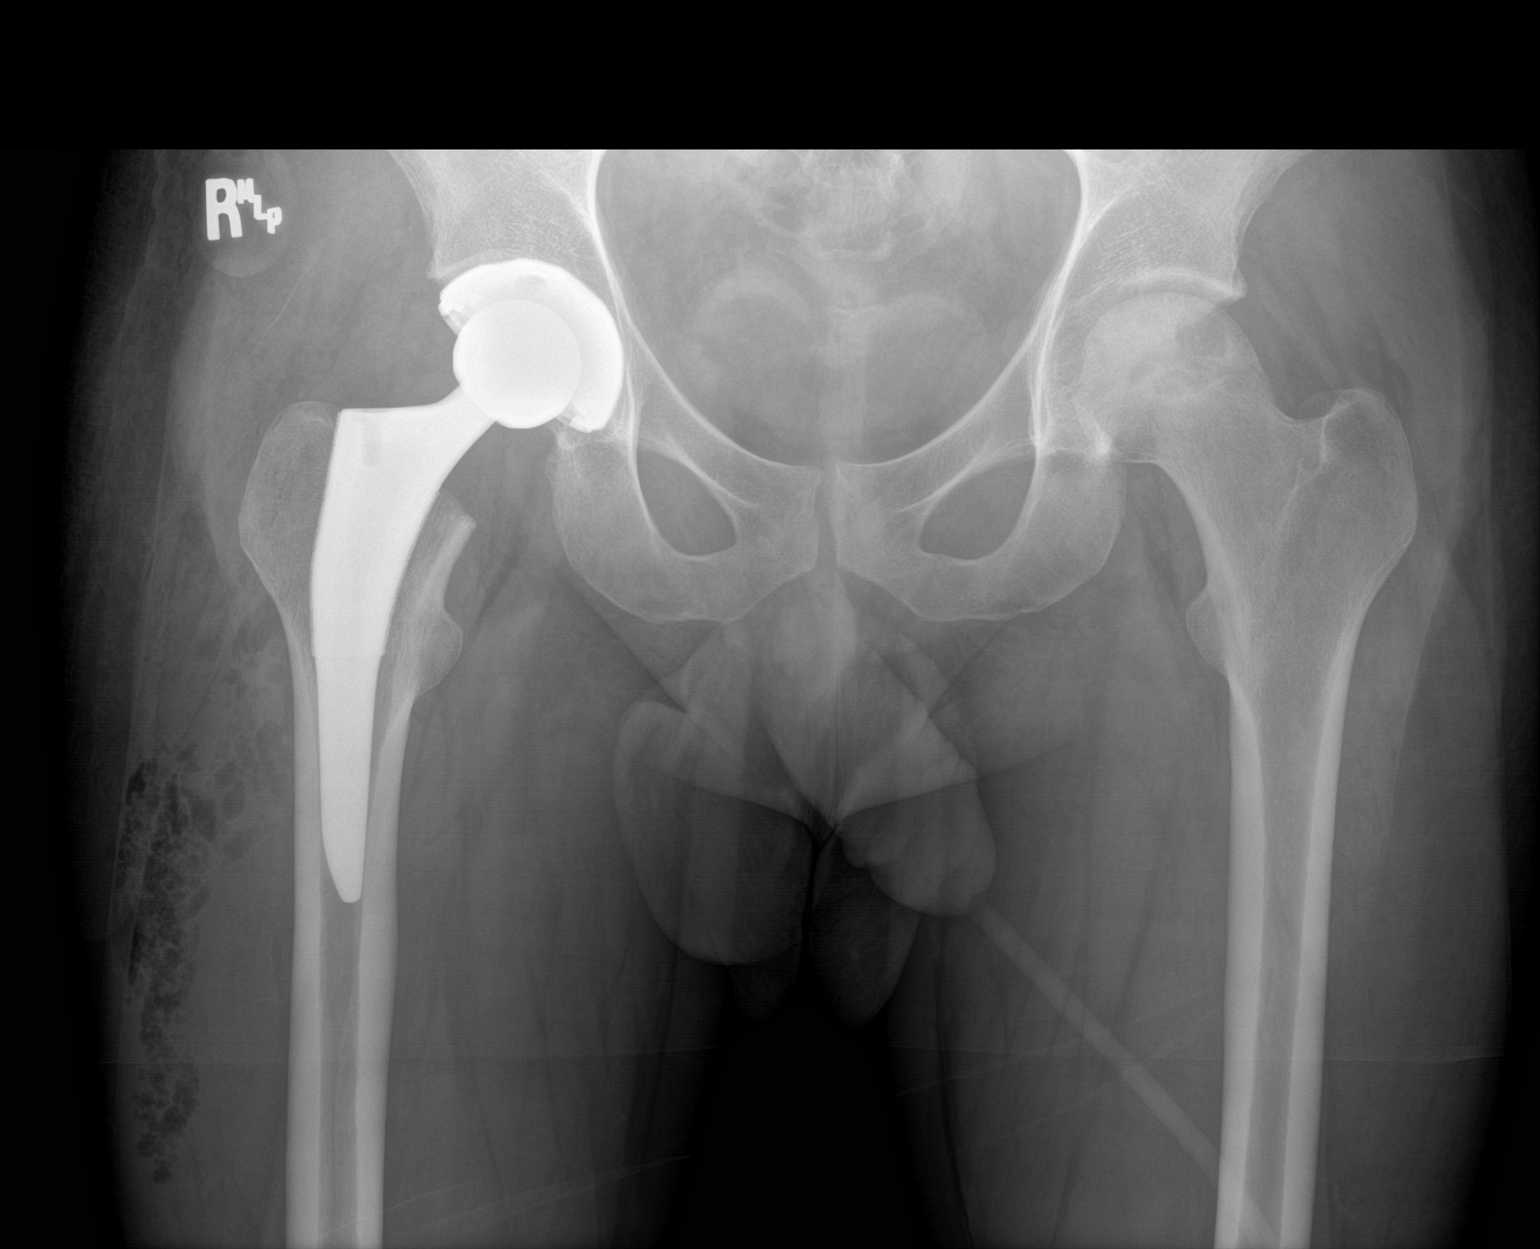

[1 of 1 positions shown; findings below may reference images not displayed]

FINDINGS: Sclerosis and probable cysts in the left humeral head. Pubic
symphysis and rami are intact. Status post right hip replacement
with normal alignment. Gas in the soft tissues of the proximal
thigh.
IMPRESSION: Status post right hip replacement with normal alignment. Gas in the
soft tissues of the proximal thigh consistent with recent surgery.

Sclerosis and probable cystic change in the left humeral head.

## 2019-09-02 IMAGING — RF DG C-ARM 61-120 MIN
1 series · 3 of 3 positions shown · non-contrast
Comparison: None.

CLINICAL DATA: Status post left total hip arthroplasty.

EXAM:
OPERATIVE left HIP (WITH PELVIS IF PERFORMED) 3 VIEWS
TECHNIQUE: Fluoroscopic spot image(s) were submitted for interpretation
post-operatively.
FLUOROSCOPY TIME:  23 seconds.

[Series 1: run · 3 of 3 slices shown]
[im 1/3]
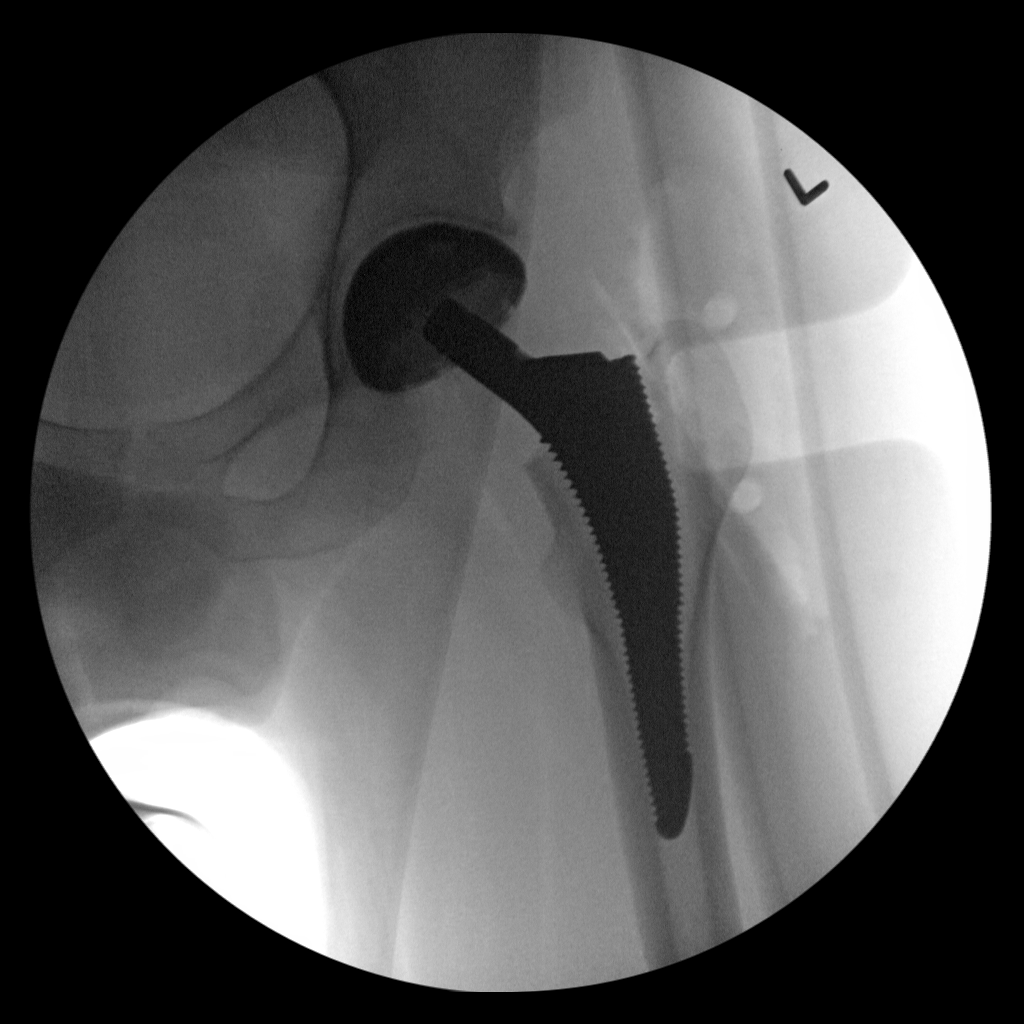
[im 2/3]
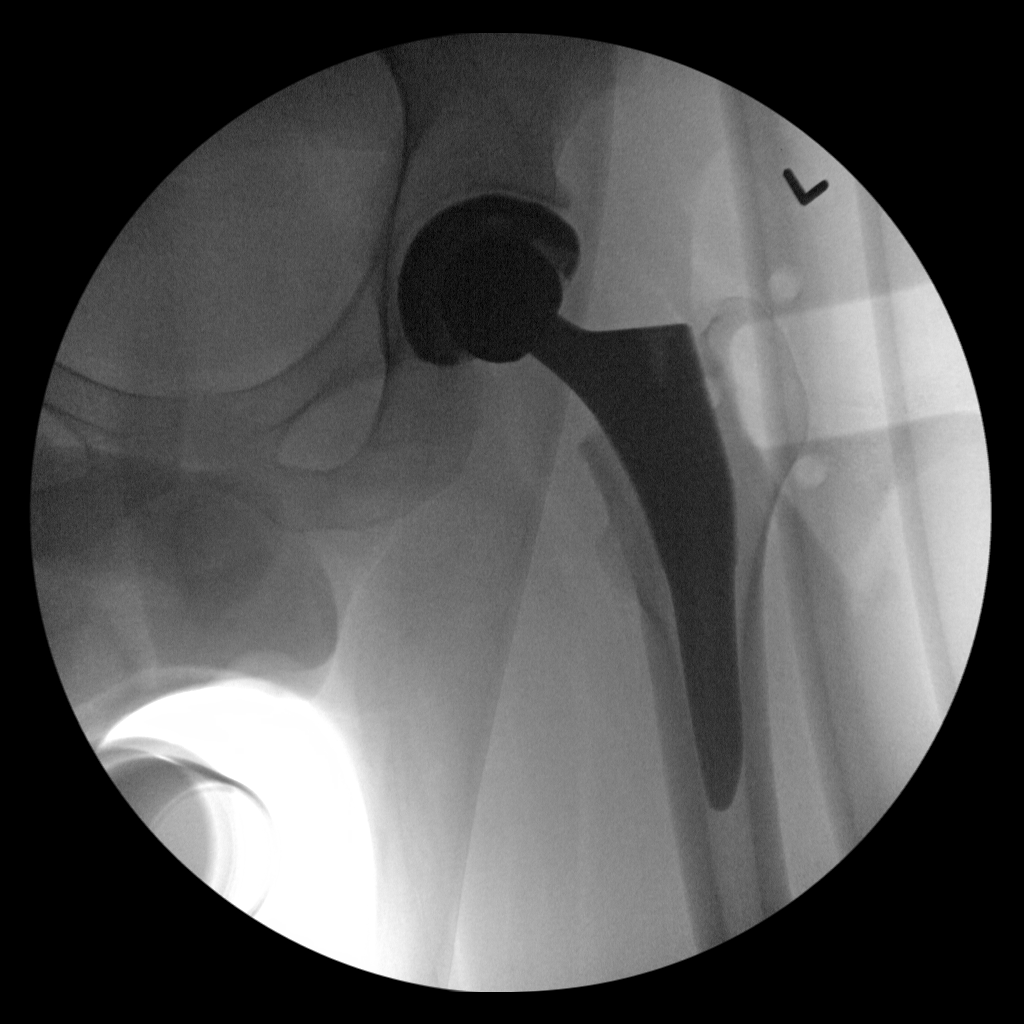
[im 3/3]
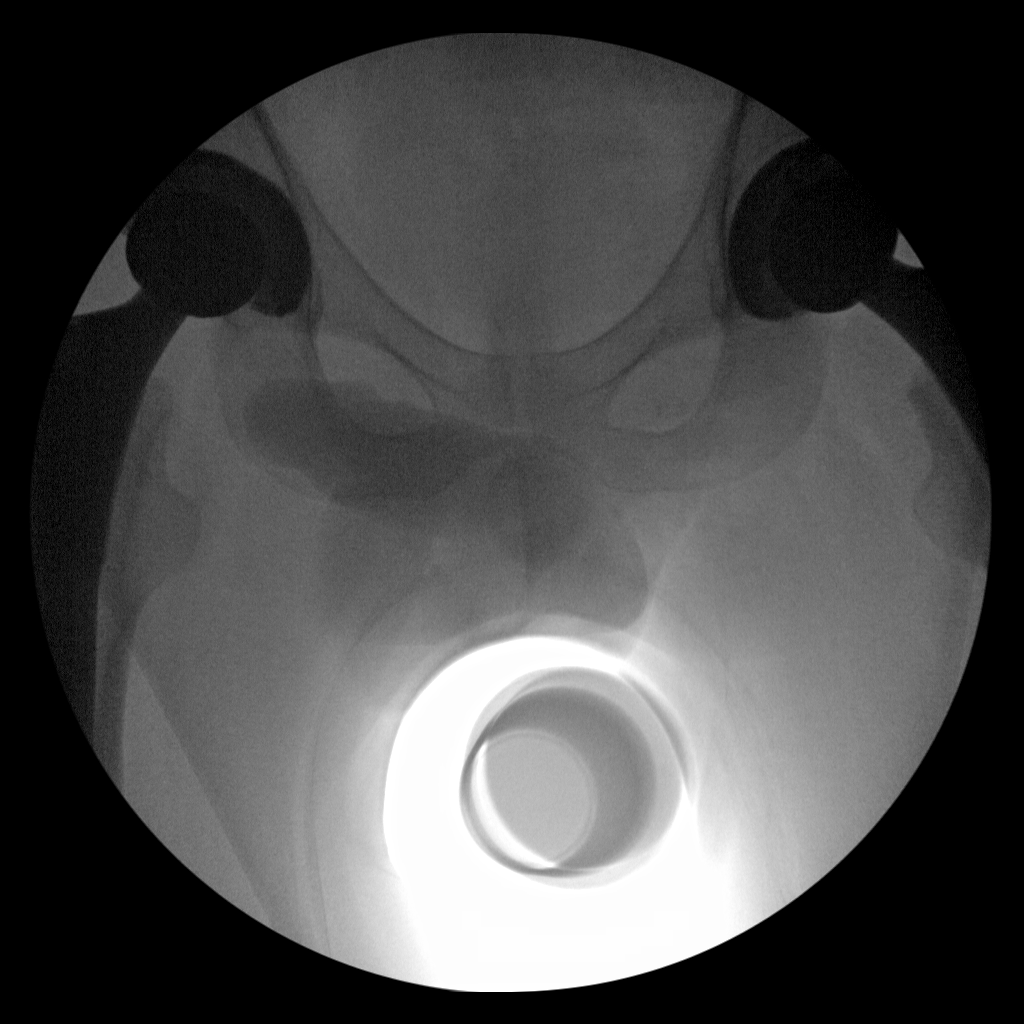

[3 of 3 positions shown; findings below may reference images not displayed]

FINDINGS: Three intraoperative fluoroscopic images demonstrate placement of
left total hip arthroplasty. The femoral and acetabular components
appear to be well situated.
IMPRESSION: Status post left total hip arthroplasty.
